# Patient Record
Sex: Female | Born: 2000 | Race: Black or African American | Hispanic: Yes | Marital: Single | Smoking: Never smoker
Health system: Southern US, Community
[De-identification: ages and names within clinical notes are randomized; demographics above are authoritative.]

## PROBLEM LIST (undated history)

## (undated) DIAGNOSIS — A389 Scarlet fever, uncomplicated: Secondary | ICD-10-CM

## (undated) HISTORY — DX: Scarlet fever, uncomplicated: A38.9

---
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
## 2020-02-15 ENCOUNTER — Emergency Department (HOSPITAL_COMMUNITY)
Admission: EM | Admit: 2020-02-15 | Discharge: 2020-02-15 | Disposition: A | Discharge status: Home / Self Care | Payer: BC Managed Care – PPO | Attending: Emergency Medicine | Admitting: Emergency Medicine

## 2020-02-15 ENCOUNTER — Encounter (HOSPITAL_COMMUNITY): Payer: Self-pay | Admitting: Emergency Medicine

## 2020-02-15 ENCOUNTER — Other Ambulatory Visit: Payer: Self-pay

## 2020-02-15 DIAGNOSIS — M79671 Pain in right foot: Secondary | ICD-10-CM | POA: Diagnosis present

## 2020-02-15 DIAGNOSIS — M722 Plantar fascial fibromatosis: Secondary | ICD-10-CM | POA: Diagnosis not present

## 2020-02-15 MED ORDER — NAPROXEN 375 MG PO TABS: 375.0000 mg | ORAL_TABLET | Freq: Two times a day (BID) | ORAL | 0 refills | Status: AC

## 2020-02-15 NOTE — Discharge Instructions (Signed)
You will need to obtain more support such as insert or sneakers with support for your shoes.  I have prescribed anti inflammatories to help with the pain. Follow up with orthopedics as needed. Their phone number is attached to your chart.

## 2020-02-15 NOTE — ED Triage Notes (Signed)
Pt reports bilateral heel pain since the summer.  States pain worse in the mornings when she gets up.  Pain in R foot is radiating to R calf today.

## 2020-02-15 NOTE — ED Provider Notes (Signed)
[facility] EMERGENCY DEPARTMENT Provider Note   CSN: [id number] Arrival date & time: 02/15/20  1519     History Chief Complaint  Patient presents with  . Foot Pain    Beverly Clark is a 20 y.o. female.  20 y.o female with no PMH presents to the ED with a chief complaint of BL foot pain x >6 months. Patient describes pain along the heel with radiation to the back of her foot.Symptoms are exacerbate in the morning when " I take my first step getting out of bed", in addition after work as she stands for several hours. She has not taken any medication for improvement in symptoms. Rest seem so somewhat alleviate this. No fall, no fever, no prior surgeries to her feet. No calf tenderness.   The history is provided by the patient.  Foot Pain       History reviewed. No pertinent past medical history.  There are no problems to display for this patient.   History reviewed. No pertinent surgical history.   OB History   No obstetric history on file.     Family History  Problem Relation Age of Onset  . Diabetes Maternal Grandmother   . Cancer Maternal Grandmother     Social History   Tobacco Use  . Smoking status: Never Smoker  . Smokeless tobacco: Never Used  Substance Use Topics  . Alcohol use: Never  . Drug use: Never    Home Medications Prior to Admission medications   Medication Sig Start Date End Date Taking? Authorizing Provider  naproxen (NAPROSYN) 375 MG tablet Take 1 tablet (375 mg total) by mouth 2 (two) times daily for 7 days. 02/15/20 02/22/20 Yes Tikesha Mort, Leonie Douglas, PA-C    Allergies    Penicillins  Review of Systems   Review of Systems  Constitutional: Negative for fever.  Musculoskeletal: Positive for arthralgias.    Physical Exam Updated Vital Signs BP (!) 155/92 (BP Location: Right Arm)   Pulse 93   Temp 98.6 F (37 C) (Oral)   Resp 16   Ht 5\' 6"  (1.676 m)   Wt 110.2 kg   LMP 01/26/2020   SpO2 99%   BMI 39.22 kg/m    Physical Exam Vitals and nursing note reviewed.  Constitutional:      Appearance: Normal appearance. She is obese.  HENT:     Head: Normocephalic and atraumatic.  Cardiovascular:     Rate and Rhythm: Normal rate.     Pulses:          Dorsalis pedis pulses are 2+ on the right side and 2+ on the left side.       Posterior tibial pulses are 2+ on the right side and 2+ on the left side.  Pulmonary:     Effort: Pulmonary effort is normal.  Abdominal:     General: Abdomen is flat.  Musculoskeletal:     Cervical back: Normal range of motion and neck supple.  Feet:     Right foot:     Skin integrity: Skin integrity normal. No erythema, warmth or callus.     Left foot:     Skin integrity: Skin integrity normal. No erythema, warmth or callus.     Comments: Pulses present, sensation is intact throughout. No pain with flexion or extension. No pain along the medial or lateral malleolus. Pain along the heel region extending into the back of the foot. No calf tenderness bilaterally or pitting edema. Skin:    General:  Skin is warm and dry.  Neurological:     Mental Status: She is alert.     ED Results / Procedures / Treatments   Labs (all labs ordered are listed, but only abnormal results are displayed) Labs Reviewed - No data to display  EKG None  Radiology No results found.  Procedures Procedures   Medications Ordered in ED Medications - No data to display  ED Course  I have reviewed the triage vital signs and the nursing notes.  Pertinent labs & imaging results that were available during my care of the patient were reviewed by me and considered in my medical decision making (see chart for details).    MDM Rules/Calculators/A&P     Patient with no pertinent past medical history presents to the ED with a chief complaint of bilateral foot pain for the past greater than 6 months. She describes the pain as sharp sensation to the heel with radiating to the back of her foot.  No prior injury, no falls.  During evaluation feet are neurovascularly intact, she is ambulatory in the ED with a steady gait. There is pain along the heel with palpation to the back of her ankle. No pain at the lateral or medial malleolus. Suspect likely plantar fasciitis, patient was told previously she had her feet, during evaluation. No arches present to bilateral feet. We discussed supportive shoes, anti-inflammatories to help with pain.  Vitals are within normal limits, no changes in the skin consistent with infection. No calf tenderness, no prior history of DVT. Have a low suspicion for this at this time. Discussed this with patient at length, return precautions discussed.   Portions of this note were generated with Scientist, clinical (histocompatibility and immunogenetics). Dictation errors may occur despite best attempts at proofreading.  Final Clinical Impression(s) / ED Diagnoses Final diagnoses:  Plantar fasciitis of right foot    Rx / DC Orders ED Discharge Orders         Ordered    naproxen (NAPROSYN) 375 MG tablet  2 times daily        02/15/20 2104           Claude Manges, PA-C 02/15/20 2108    Mancel Bale, MD 02/16/20 1606

## 2020-02-15 NOTE — ED Notes (Signed)
PA notified of pt.

## 2021-02-06 ENCOUNTER — Emergency Department (HOSPITAL_COMMUNITY)
Admission: EM | Admit: 2021-02-06 | Discharge: 2021-02-07 | Disposition: A | Discharge status: Home / Self Care | Payer: Medicaid Other | Source: Home / Self Care | Attending: Emergency Medicine | Admitting: Emergency Medicine

## 2021-02-06 ENCOUNTER — Encounter (HOSPITAL_COMMUNITY): Payer: Self-pay

## 2021-02-06 DIAGNOSIS — Z20822 Contact with and (suspected) exposure to covid-19: Secondary | ICD-10-CM | POA: Insufficient documentation

## 2021-02-06 DIAGNOSIS — B349 Viral infection, unspecified: Secondary | ICD-10-CM

## 2021-02-06 LAB — RESP PANEL BY RT-PCR (FLU A&B, COVID) ARPGX2
Influenza A by PCR: NEGATIVE
Influenza B by PCR: NEGATIVE
SARS Coronavirus 2 by RT PCR: NEGATIVE

## 2021-02-06 MED ORDER — ONDANSETRON 4 MG PO TBDP
4.0000 mg | ORAL_TABLET | Freq: Once | ORAL | Status: AC
  Administered 2021-02-07: 4 mg via ORAL

## 2021-02-06 MED ORDER — ACETAMINOPHEN 500 MG PO TABS
1000.0000 mg | ORAL_TABLET | Freq: Four times a day (QID) | ORAL | Status: DC | PRN
  Administered 2021-02-07: 1000 mg via ORAL
  Filled 2021-02-06 (×2): qty 2

## 2021-02-06 NOTE — ED Provider Triage Note (Signed)
Emergency Medicine Provider Triage Evaluation Note  Beverly Clark , a 21 y.o. female  was evaluated in triage.  Pt complains of fever onset today.  She has associated chills, malaise, generalized body aches, nausea.  Has not tried any medications for her symptoms.  Denies vomiting.  Denies sick contacts.  Review of Systems  Positive: As per HPI above. Negative: Vomiting  Physical Exam  BP 130/76 (BP Location: Right Arm)    Pulse (!) 134    Temp (!) 102.3 F (39.1 C) (Oral)    Resp 18    Ht 5\' 6"  (1.676 m)    Wt 110 kg    SpO2 100%    BMI 39.14 kg/m  Gen:   Awake, no distress   Resp:  Normal effort  MSK:   Moves extremities without difficulty   Medical Decision Making  Medically screening exam initiated at 6:34 PM.  Appropriate orders placed.  Beverly Clark was informed that the remainder of the evaluation will be completed by another provider, this initial triage assessment does not replace that evaluation, and the importance of remaining in the ED until their evaluation is complete.    Reka Wist, [place] A, PA-C 02/06/21 1835

## 2021-02-06 NOTE — ED Triage Notes (Signed)
Pt arrives POV for eval of chills/fever, malaise and body aches.Reports she is concerned this may be due to a new tattoo on her R posterior thigh. Tattoo appears to be healing appropriately w/ no obvious s/sx of infection.

## 2021-02-07 ENCOUNTER — Other Ambulatory Visit: Payer: Self-pay

## 2021-02-07 ENCOUNTER — Encounter (HOSPITAL_COMMUNITY): Payer: Self-pay

## 2021-02-07 ENCOUNTER — Emergency Department (HOSPITAL_COMMUNITY): Payer: Medicaid Other

## 2021-02-07 ENCOUNTER — Ambulatory Visit (HOSPITAL_COMMUNITY)
Admission: EM | Admit: 2021-02-07 | Discharge: 2021-02-07 | Disposition: A | Discharge status: Home / Self Care | Payer: Medicaid Other | Attending: Family Medicine | Admitting: Family Medicine

## 2021-02-07 DIAGNOSIS — K529 Noninfective gastroenteritis and colitis, unspecified: Secondary | ICD-10-CM

## 2021-02-07 DIAGNOSIS — R509 Fever, unspecified: Secondary | ICD-10-CM | POA: Diagnosis not present

## 2021-02-07 MED ORDER — CLINDAMYCIN HCL 300 MG PO CAPS: 300.0000 mg | ORAL_CAPSULE | Freq: Three times a day (TID) | ORAL | 0 refills | Status: DC

## 2021-02-07 MED ORDER — ONDANSETRON 4 MG PO TBDP: 4.0000 mg | ORAL_TABLET | Freq: Three times a day (TID) | ORAL | 0 refills | Status: DC | PRN

## 2021-02-07 MED ORDER — ACETAMINOPHEN 325 MG PO TABS: 650.0000 mg | ORAL_TABLET | Freq: Once | ORAL | Status: DC

## 2021-02-07 MED ORDER — ONDANSETRON 4 MG PO TBDP
4.0000 mg | ORAL_TABLET | Freq: Once | ORAL | Status: AC
  Administered 2021-02-07: 4 mg via ORAL
  Filled 2021-02-07: qty 1

## 2021-02-07 NOTE — Discharge Instructions (Addendum)
Use the ondansetron that was prescribed last evening every 8 hours as needed for nausea or vomiting.  Take frequent small amounts of oral fluids.  Take Imodium if your diarrhea is persistent.  If you are not keeping fluids down despite medication and trying to orally hydrate, then return to the emergency room for possible further evaluation with lab work and possible IV hydration   Take clindamycin 300 mg 1 capsule 3 times daily for 5 days

## 2021-02-07 NOTE — ED Provider Notes (Signed)
Patient was medically screened earlier, came in with body aches fevers and chills, respiratory panel was unremarkable  Went to reassess the patient noted that she was febrile and her BP was 96/53 will add on further lab work, patient will need further work-up in the emergency department.       Carroll Sage, PA-C 02/07/21 0459    Dione Booze, MD 02/07/21 [phone]

## 2021-02-07 NOTE — ED Provider Notes (Signed)
[facility] EMERGENCY DEPARTMENT Provider Note   CSN: [id number] Arrival date & time: 02/06/21  1806     History  Chief Complaint  Patient presents with   Fever    Beverly Clark is a 21 y.o. female.  HPI     21 year old female with no significant medical history comes in with chief complaint of fever  Patient indicates that she started having fevers, chills and body aches yesterday.  She recently just had a tattoo on her right thigh, and was concerned that it could be infected.  Patient is sleeping during my assessment.  She indicates that everyone who has seen the tattoo do not think it is infected, and she also now feels like it is not infected.  She does not really think I need to assess the tattoo/skin.  Patient denies any chest pain, shortness of breath, URI-like symptoms, sore throat, cough, UTI-like symptoms, abdominal pain.  She denies any sick contacts.    Home Medications Prior to Admission medications   Not on File      Allergies    Penicillins    Review of Systems   Review of Systems  Physical Exam Updated Vital Signs BP (!) 96/53    Pulse 91    Temp (!) 102.8 F (39.3 C)    Resp 18    Ht 5\' 6"  (1.676 m)    Wt 110 kg    SpO2 98%    BMI 39.14 kg/m  Physical Exam Vitals and nursing note reviewed.  Constitutional:      Appearance: She is well-developed.  HENT:     Head: Atraumatic.  Cardiovascular:     Rate and Rhythm: Normal rate.  Pulmonary:     Effort: Pulmonary effort is normal.  Musculoskeletal:     Cervical back: Normal range of motion and neck supple.  Skin:    General: Skin is warm and dry.  Neurological:     Mental Status: She is alert and oriented to person, place, and time.    ED Results / Procedures / Treatments   Labs (all labs ordered are listed, but only abnormal results are displayed) Labs Reviewed  RESP PANEL BY RT-PCR (FLU A&B, COVID) ARPGX2    EKG None  Radiology No results  found.  Procedures Procedures    Medications Ordered in ED Medications  acetaminophen (TYLENOL) tablet 1,000 mg (1,000 mg Oral Given 02/07/21 0450)  acetaminophen (TYLENOL) tablet 650 mg (has no administration in time range)  ondansetron (ZOFRAN-ODT) disintegrating tablet 4 mg (4 mg Oral Given 02/07/21 0255)  ondansetron (ZOFRAN-ODT) disintegrating tablet 4 mg (4 mg Oral Given 02/07/21 0449)    ED Course/ Medical Decision Making/ A&P                           Medical Decision Making Risk OTC drugs.  21 year old female comes in with chief complaint of fever. It appears that what drove her to come to the ED was concerned for infected soft tissue post tattoo placement on her thigh.  But it seems like her mind is at ease now, and she does not even want me to assess the thigh.  Given that she is noted to be febrile, we did ask comprehensive questions to see if a source of infection can be picked up on history.  Patient has no sore throat, URI-like symptoms, neck pain, headache, chest pain, abdominal pain, UTI-like symptoms.  She denies any nausea or vomiting.  My suspicion for her symptoms being viral in nature is high given the body aches associated with her symptoms.  Discussed this concern with the patient.  Informed her that we could get some basic labs to make sure that rest of her organs are looking okay.  She indicates to me that she is comfortable not getting any further assessment now, and returning to the ER if her symptoms get worse.  Will order Tylenol for her fever. Recommended quarantine until she is better. Informed her that her test is negative for COVID and flu.  Final Clinical Impression(s) / ED Diagnoses Final diagnoses:  Viral illness    Rx / DC Orders ED Discharge Orders     None         Varney Biles, MD 02/07/21 [phone]

## 2021-02-07 NOTE — ED Triage Notes (Signed)
Pt states seen in ED last night for same and home on tylenol. Pt c/o fever, chills, body aches, weakness, and headache since yesterday. C/o diarrhea today. States had imodium and tylenol an hour ago.

## 2021-02-07 NOTE — Discharge Instructions (Addendum)
Likely a viral infection, recommend over-the-counter pain medications like ibuprofen Tylenol for fever and pain control, nasal decongestions like Flonase and Zyrtec, Mucinex for cough.  If not eating recommend supplementing with Gatorade to help with electrolyte supplementation.  Follow-up PCP for further evaluation.  Come back to the emergency department if your symptoms get worse, you develop chest pain, shortness of breath, severe abdominal pain, uncontrolled nausea, vomiting, bloody diarrhea.

## 2021-02-07 NOTE — ED Notes (Signed)
Zofran  order removed by mistake  still active

## 2021-02-07 NOTE — ED Provider Notes (Signed)
[place]    CSN: [id number] Arrival date & time: 02/07/21  1941      History   Chief Complaint Chief Complaint  Patient presents with   Fever    HPI Beverly Clark is a 21 y.o. female.    Fever Here with a history of fever, nausea and vomiting since yesterday in the early afternoon.  She was seen last evening in the [facility], ER for the symptoms.  PCR for flu and COVID were negative.  She was prescribed Zofran; now she has not been using that because she states she is not vomiting.  She does have some nausea when she moves around, but now she is having more diarrhea.  She has had about 4 episodes of diarrhea today no blood.  She has been concerned about her tattoo possibly looking infected her tattoo was placed about 2 days ago.  History reviewed. No pertinent past medical history.  There are no problems to display for this patient.   History reviewed. No pertinent surgical history.  OB History   No obstetric history on file.      Home Medications    Prior to Admission medications   Medication Sig Start Date End Date Taking? Authorizing Provider  clindamycin (CLEOCIN) 300 MG capsule Take 1 capsule (300 mg total) by mouth 3 (three) times daily for 5 days. 02/07/21 02/12/21 Yes Barrett Henle, MD  ondansetron (ZOFRAN-ODT) 4 MG disintegrating tablet Take 1 tablet (4 mg total) by mouth every 8 (eight) hours as needed for nausea or vomiting. 02/07/21  Yes Arlissa Monteverde, Gwenlyn Perking, MD    Family History Family History  Problem Relation Age of Onset   Diabetes Maternal Grandmother    Cancer Maternal Grandmother     Social History Social History   Tobacco Use   Smoking status: Never   Smokeless tobacco: Never  Substance Use Topics   Alcohol use: Never   Drug use: Never     Allergies   Penicillins   Review of Systems Review of Systems  Constitutional:  Positive for fever.    Physical Exam Triage Vital Signs ED Triage Vitals [02/07/21  1953]  Enc Vitals Group     BP 118/64     Pulse Rate (!) 129     Resp 18     Temp (!) 100.5 F (38.1 C)     Temp Source Oral     SpO2 93 %     Weight      Height      Head Circumference      Peak Flow      Pain Score      Pain Loc      Pain Edu?      Excl. in [place]?    No data found.  Updated Vital Signs BP 118/64 (BP Location: Right Arm)    Pulse (!) 129    Temp (!) 100.5 F (38.1 C) (Oral)    Resp 18    LMP 01/18/2021    SpO2 93%   Visual Acuity Right Eye Distance:   Left Eye Distance:   Bilateral Distance:    Right Eye Near:   Left Eye Near:    Bilateral Near:     Physical Exam Vitals reviewed.  Constitutional:      General: She is not in acute distress.    Appearance: She is not toxic-appearing or diaphoretic.     Comments: Lying on the exam table. Answers questions easily, but mom does  answer a lot of my questions instead  HENT:     Nose: Nose normal.     Mouth/Throat:     Mouth: Mucous membranes are moist.  Eyes:     Extraocular Movements: Extraocular movements intact.     Pupils: Pupils are equal, round, and reactive to light.  Cardiovascular:     Rate and Rhythm: Normal rate and regular rhythm.     Heart sounds: No murmur heard. Pulmonary:     Effort: Pulmonary effort is normal.     Breath sounds: Normal breath sounds.  Abdominal:     Palpations: Abdomen is soft.  Skin:    Comments: There is some faint pink nest to the skin and some of her tattoo.  No induration and no ulceration  Neurological:     Mental Status: She is alert and oriented to person, place, and time.  Psychiatric:        Behavior: Behavior normal.     UC Treatments / Results  Labs (all labs ordered are listed, but only abnormal results are displayed) Labs Reviewed - No data to display  EKG   Radiology DG Chest 1 View  Result Date: 02/07/2021 CLINICAL DATA:  21 year old female with fever of unknown origin. EXAM: CHEST  1 VIEW COMPARISON:  None. FINDINGS: PA view at 0520  hours. Normal lung volumes and mediastinal contours. Visualized tracheal air column is within normal limits. Mild hair artifact. Lung markings are within normal limits, both lungs appear clear. No pneumothorax or pleural effusion. Negative visible bowel gas and osseous structures. IMPRESSION: Negative.  No cardiopulmonary abnormality. Electronically Signed   By: Genevie Ann M.D.   On: 02/07/2021 06:24    Procedures Procedures (including critical care time)  Medications Ordered in UC Medications - No data to display  Initial Impression / Assessment and Plan / UC Course  I have reviewed the triage vital signs and the nursing notes.  Pertinent labs & imaging results that were available during my care of the patient were reviewed by me and considered in my medical decision making (see chart for details).     Mom and pt had expected lab work and IVF. Please note, she has not been throwing up today. Discussed using the zofran rx'd last night for the nausea, and to hydrate orally, since she has not been throwing up. Only take imodium if diarrhea is persistent.   Clinda for poss cellulitis. She is allergic to pcn. Final Clinical Impressions(s) / UC Diagnoses   Final diagnoses:  Gastroenteritis  Fever, unspecified fever cause     Discharge Instructions      Use the ondansetron that was prescribed last evening every 8 hours as needed for nausea or vomiting.  Take frequent small amounts of oral fluids.  Take Imodium if your diarrhea is persistent.  If you are not keeping fluids down despite medication and trying to orally hydrate, then return to the emergency room for possible further evaluation with lab work and possible IV hydration   Take clindamycin 300 mg 1 capsule 3 times daily for 5 days     ED Prescriptions     Medication Sig Dispense Auth. Provider   ondansetron (ZOFRAN-ODT) 4 MG disintegrating tablet Take 1 tablet (4 mg total) by mouth every 8 (eight) hours as needed for nausea or  vomiting. 10 tablet Barrett Henle, MD   clindamycin (CLEOCIN) 300 MG capsule Take 1 capsule (300 mg total) by mouth 3 (three) times daily for 5 days. 15 capsule Jeston Junkins,  Gwenlyn Perking, MD      PDMP not reviewed this encounter.   Barrett Henle, MD 02/07/21 2024

## 2021-02-07 NOTE — ED Notes (Signed)
Pt rechecked the pa saw

## 2021-02-08 ENCOUNTER — Emergency Department (HOSPITAL_COMMUNITY): Payer: Medicaid Other

## 2021-02-08 ENCOUNTER — Inpatient Hospital Stay (HOSPITAL_COMMUNITY)
Admission: EM | Admit: 2021-02-08 | Discharge: 2021-02-11 | DRG: 871 | Disposition: A | Discharge status: Home / Self Care | Payer: Medicaid Other | Attending: Family Medicine | Admitting: Family Medicine

## 2021-02-08 ENCOUNTER — Encounter (HOSPITAL_COMMUNITY): Payer: Self-pay

## 2021-02-08 ENCOUNTER — Other Ambulatory Visit: Payer: Self-pay

## 2021-02-08 DIAGNOSIS — E876 Hypokalemia: Secondary | ICD-10-CM | POA: Diagnosis present

## 2021-02-08 DIAGNOSIS — B370 Candidal stomatitis: Secondary | ICD-10-CM | POA: Diagnosis present

## 2021-02-08 DIAGNOSIS — N179 Acute kidney failure, unspecified: Secondary | ICD-10-CM | POA: Diagnosis present

## 2021-02-08 DIAGNOSIS — Z978 Presence of other specified devices: Secondary | ICD-10-CM

## 2021-02-08 DIAGNOSIS — L818 Other specified disorders of pigmentation: Secondary | ICD-10-CM

## 2021-02-08 DIAGNOSIS — R000 Tachycardia, unspecified: Secondary | ICD-10-CM | POA: Diagnosis present

## 2021-02-08 DIAGNOSIS — R3129 Other microscopic hematuria: Secondary | ICD-10-CM | POA: Diagnosis present

## 2021-02-08 DIAGNOSIS — A419 Sepsis, unspecified organism: Principal | ICD-10-CM

## 2021-02-08 DIAGNOSIS — R197 Diarrhea, unspecified: Secondary | ICD-10-CM | POA: Diagnosis present

## 2021-02-08 DIAGNOSIS — R0602 Shortness of breath: Secondary | ICD-10-CM | POA: Diagnosis not present

## 2021-02-08 DIAGNOSIS — J189 Pneumonia, unspecified organism: Secondary | ICD-10-CM | POA: Diagnosis present

## 2021-02-08 DIAGNOSIS — E871 Hypo-osmolality and hyponatremia: Secondary | ICD-10-CM | POA: Diagnosis present

## 2021-02-08 DIAGNOSIS — K130 Diseases of lips: Secondary | ICD-10-CM | POA: Diagnosis present

## 2021-02-08 DIAGNOSIS — R591 Generalized enlarged lymph nodes: Secondary | ICD-10-CM | POA: Diagnosis present

## 2021-02-08 DIAGNOSIS — R6520 Severe sepsis without septic shock: Secondary | ICD-10-CM

## 2021-02-08 DIAGNOSIS — E860 Dehydration: Secondary | ICD-10-CM

## 2021-02-08 DIAGNOSIS — E872 Acidosis, unspecified: Secondary | ICD-10-CM | POA: Diagnosis present

## 2021-02-08 DIAGNOSIS — Z6839 Body mass index (BMI) 39.0-39.9, adult: Secondary | ICD-10-CM

## 2021-02-08 DIAGNOSIS — R748 Abnormal levels of other serum enzymes: Secondary | ICD-10-CM

## 2021-02-08 DIAGNOSIS — Z20822 Contact with and (suspected) exposure to covid-19: Secondary | ICD-10-CM | POA: Diagnosis present

## 2021-02-08 DIAGNOSIS — N139 Obstructive and reflux uropathy, unspecified: Secondary | ICD-10-CM | POA: Diagnosis present

## 2021-02-08 DIAGNOSIS — Z880 Allergy status to penicillin: Secondary | ICD-10-CM | POA: Diagnosis not present

## 2021-02-08 DIAGNOSIS — J811 Chronic pulmonary edema: Secondary | ICD-10-CM | POA: Diagnosis present

## 2021-02-08 DIAGNOSIS — E669 Obesity, unspecified: Secondary | ICD-10-CM | POA: Diagnosis present

## 2021-02-08 LAB — CBC WITH DIFFERENTIAL/PLATELET
Abs Immature Granulocytes: 0 10*3/uL (ref 0.00–0.07)
Basophils Absolute: 0 10*3/uL (ref 0.0–0.1)
Basophils Relative: 0 %
Eosinophils Absolute: 0.4 10*3/uL (ref 0.0–0.5)
Eosinophils Relative: 4 %
HCT: 39.4 % (ref 36.0–46.0)
Hemoglobin: 13.1 g/dL (ref 12.0–15.0)
Lymphocytes Relative: 11 %
Lymphs Abs: 1.1 10*3/uL (ref 0.7–4.0)
MCH: 29.8 pg (ref 26.0–34.0)
MCHC: 33.2 g/dL (ref 30.0–36.0)
MCV: 89.5 fL (ref 80.0–100.0)
Monocytes Absolute: 0.5 10*3/uL (ref 0.1–1.0)
Monocytes Relative: 5 %
Neutro Abs: 7.7 10*3/uL (ref 1.7–7.7)
Neutrophils Relative %: 80 %
Platelets: 184 10*3/uL (ref 150–400)
RBC: 4.4 MIL/uL (ref 3.87–5.11)
RDW: 13.2 % (ref 11.5–15.5)
WBC: 9.6 10*3/uL (ref 4.0–10.5)
nRBC: 0 % (ref 0.0–0.2)

## 2021-02-08 LAB — PHOSPHORUS: Phosphorus: 3.5 mg/dL (ref 2.5–4.6)

## 2021-02-08 LAB — DIFFERENTIAL
Abs Immature Granulocytes: 0.04 10*3/uL (ref 0.00–0.07)
Basophils Absolute: 0 10*3/uL (ref 0.0–0.1)
Basophils Relative: 1 %
Eosinophils Absolute: 0.2 10*3/uL (ref 0.0–0.5)
Eosinophils Relative: 3 %
Immature Granulocytes: 1 %
Lymphocytes Relative: 4 %
Lymphs Abs: 0.3 10*3/uL — ABNORMAL LOW (ref 0.7–4.0)
Monocytes Absolute: 0.1 10*3/uL (ref 0.1–1.0)
Monocytes Relative: 2 %
Neutro Abs: 5.9 10*3/uL (ref 1.7–7.7)
Neutrophils Relative %: 89 %

## 2021-02-08 LAB — BASIC METABOLIC PANEL
Anion gap: 13 (ref 5–15)
BUN: 57 mg/dL — ABNORMAL HIGH (ref 6–20)
CO2: 18 mmol/L — ABNORMAL LOW (ref 22–32)
Calcium: 7.7 mg/dL — ABNORMAL LOW (ref 8.9–10.3)
Chloride: 97 mmol/L — ABNORMAL LOW (ref 98–111)
Creatinine, Ser: 4.97 mg/dL — ABNORMAL HIGH (ref 0.44–1.00)
GFR, Estimated: 12 mL/min — ABNORMAL LOW (ref >60)
Glucose, Bld: 81 mg/dL (ref 70–99)
Potassium: 3.3 mmol/L — ABNORMAL LOW (ref 3.5–5.1)
Sodium: 128 mmol/L — ABNORMAL LOW (ref 135–145)

## 2021-02-08 LAB — CK: Total CK: 190 U/L (ref 38–234)

## 2021-02-08 LAB — RAPID URINE DRUG SCREEN, HOSP PERFORMED
Amphetamines: NOT DETECTED
Barbiturates: NOT DETECTED
Benzodiazepines: NOT DETECTED
Cocaine: NOT DETECTED
Opiates: NOT DETECTED
Tetrahydrocannabinol: NOT DETECTED

## 2021-02-08 LAB — BLOOD GAS, VENOUS
Acid-base deficit: 4.2 mmol/L — ABNORMAL HIGH (ref 0.0–2.0)
Bicarbonate: 20.4 mmol/L (ref 20.0–28.0)
O2 Saturation: 54 %
Patient temperature: 98.6
pCO2, Ven: 37.6 mmHg — ABNORMAL LOW (ref 44.0–60.0)
pH, Ven: 7.353 (ref 7.250–7.430)
pO2, Ven: 33.9 mmHg (ref 32.0–45.0)

## 2021-02-08 LAB — CBC
HCT: 37.6 % (ref 36.0–46.0)
Hemoglobin: 12.9 g/dL (ref 12.0–15.0)
MCH: 30 pg (ref 26.0–34.0)
MCHC: 34.3 g/dL (ref 30.0–36.0)
MCV: 87.4 fL (ref 80.0–100.0)
Platelets: 174 10*3/uL (ref 150–400)
RBC: 4.3 MIL/uL (ref 3.87–5.11)
RDW: 13.2 % (ref 11.5–15.5)
WBC: 6.6 10*3/uL (ref 4.0–10.5)
nRBC: 0 % (ref 0.0–0.2)

## 2021-02-08 LAB — ETHANOL: Alcohol, Ethyl (B): <10 mg/dL (ref <10)

## 2021-02-08 LAB — COMPREHENSIVE METABOLIC PANEL
ALT: 39 U/L (ref 0–44)
AST: 24 U/L (ref 15–41)
Albumin: 2.9 g/dL — ABNORMAL LOW (ref 3.5–5.0)
Alkaline Phosphatase: 48 U/L (ref 38–126)
Anion gap: 13 (ref 5–15)
BUN: 54 mg/dL — ABNORMAL HIGH (ref 6–20)
CO2: 19 mmol/L — ABNORMAL LOW (ref 22–32)
Calcium: 7.6 mg/dL — ABNORMAL LOW (ref 8.9–10.3)
Chloride: 96 mmol/L — ABNORMAL LOW (ref 98–111)
Creatinine, Ser: 5.04 mg/dL — ABNORMAL HIGH (ref 0.44–1.00)
GFR, Estimated: 12 mL/min — ABNORMAL LOW (ref >60)
Glucose, Bld: 90 mg/dL (ref 70–99)
Potassium: 3.2 mmol/L — ABNORMAL LOW (ref 3.5–5.1)
Sodium: 128 mmol/L — ABNORMAL LOW (ref 135–145)
Total Bilirubin: 2.6 mg/dL — ABNORMAL HIGH (ref 0.3–1.2)
Total Protein: 6.3 g/dL — ABNORMAL LOW (ref 6.5–8.1)

## 2021-02-08 LAB — URINALYSIS, ROUTINE W REFLEX MICROSCOPIC
Bilirubin Urine: NEGATIVE
Glucose, UA: NEGATIVE mg/dL
Hgb urine dipstick: NEGATIVE
Ketones, ur: NEGATIVE mg/dL
Nitrite: NEGATIVE
Protein, ur: 30 mg/dL — AB
Specific Gravity, Urine: 1.021 (ref 1.005–1.030)
pH: 5 (ref 5.0–8.0)

## 2021-02-08 LAB — TSH: TSH: 0.775 u[IU]/mL (ref 0.350–4.500)

## 2021-02-08 LAB — RAPID HIV SCREEN (HIV 1/2 AB+AG)
HIV 1/2 Antibodies: NONREACTIVE
HIV-1 P24 Antigen - HIV24: NONREACTIVE

## 2021-02-08 LAB — HEPATIC FUNCTION PANEL
ALT: 38 U/L (ref 0–44)
AST: 25 U/L (ref 15–41)
Albumin: 2.8 g/dL — ABNORMAL LOW (ref 3.5–5.0)
Alkaline Phosphatase: 48 U/L (ref 38–126)
Bilirubin, Direct: 1.3 mg/dL — ABNORMAL HIGH (ref 0.0–0.2)
Indirect Bilirubin: 1.3 mg/dL — ABNORMAL HIGH (ref 0.3–0.9)
Total Bilirubin: 2.6 mg/dL — ABNORMAL HIGH (ref 0.3–1.2)
Total Protein: 6.2 g/dL — ABNORMAL LOW (ref 6.5–8.1)

## 2021-02-08 LAB — PROTIME-INR
INR: 1.2 (ref 0.8–1.2)
Prothrombin Time: 15.1 seconds (ref 11.4–15.2)

## 2021-02-08 LAB — MAGNESIUM: Magnesium: 1.3 mg/dL — ABNORMAL LOW (ref 1.7–2.4)

## 2021-02-08 LAB — RESP PANEL BY RT-PCR (FLU A&B, COVID) ARPGX2
Influenza A by PCR: NEGATIVE
Influenza B by PCR: NEGATIVE
SARS Coronavirus 2 by RT PCR: NEGATIVE

## 2021-02-08 LAB — APTT: aPTT: 32 seconds (ref 24–36)

## 2021-02-08 LAB — LACTIC ACID, PLASMA
Lactic Acid, Venous: 2.5 mmol/L (ref 0.5–1.9)
Lactic Acid, Venous: 2.6 mmol/L (ref 0.5–1.9)

## 2021-02-08 LAB — PROCALCITONIN: Procalcitonin: 19.2 ng/mL

## 2021-02-08 LAB — HCG, QUANTITATIVE, PREGNANCY: hCG, Beta Chain, Quant, S: 1 m[IU]/mL (ref <5)

## 2021-02-08 LAB — SEDIMENTATION RATE: Sed Rate: 40 mm/hr — ABNORMAL HIGH (ref 0–22)

## 2021-02-08 LAB — SODIUM, URINE, RANDOM: Sodium, Ur: 11 mmol/L

## 2021-02-08 LAB — CREATININE, URINE, RANDOM: Creatinine, Urine: 355.72 mg/dL

## 2021-02-08 MED ORDER — MAGNESIUM SULFATE 2 GM/50ML IV SOLN
2.0000 g | Freq: Once | INTRAVENOUS | Status: AC
  Administered 2021-02-08: 23:00:00 2 g via INTRAVENOUS
  Filled 2021-02-08: qty 50

## 2021-02-08 MED ORDER — METRONIDAZOLE 500 MG/100ML IV SOLN
500.0000 mg | Freq: Once | INTRAVENOUS | Status: AC
  Administered 2021-02-08: 20:00:00 500 mg via INTRAVENOUS
  Filled 2021-02-08: qty 100

## 2021-02-08 MED ORDER — SODIUM CHLORIDE 0.9 % IV BOLUS
1000.0000 mL | Freq: Once | INTRAVENOUS | Status: AC
Start: 2021-02-09 — End: 2021-02-09
  Administered 2021-02-09: 1000 mL via INTRAVENOUS

## 2021-02-08 MED ORDER — ACETAMINOPHEN 325 MG PO TABS
650.0000 mg | ORAL_TABLET | Freq: Once | ORAL | Status: AC | PRN
  Administered 2021-02-08: 15:00:00 650 mg via ORAL
  Filled 2021-02-08: qty 2

## 2021-02-08 MED ORDER — LACTATED RINGERS IV SOLN: INTRAVENOUS | Status: DC

## 2021-02-08 MED ORDER — ACETAMINOPHEN 650 MG RE SUPP: 650.0000 mg | Freq: Four times a day (QID) | RECTAL | Status: DC | PRN

## 2021-02-08 MED ORDER — METRONIDAZOLE 500 MG/100ML IV SOLN
500.0000 mg | Freq: Two times a day (BID) | INTRAVENOUS | Status: DC
  Administered 2021-02-09 – 2021-02-11 (×5): 500 mg via INTRAVENOUS
  Filled 2021-02-08 (×5): qty 100

## 2021-02-08 MED ORDER — ACETAMINOPHEN 325 MG PO TABS
650.0000 mg | ORAL_TABLET | Freq: Four times a day (QID) | ORAL | Status: DC | PRN
  Administered 2021-02-11: 650 mg via ORAL
  Filled 2021-02-08 (×2): qty 2

## 2021-02-08 MED ORDER — HYDROCODONE-ACETAMINOPHEN 5-325 MG PO TABS
1.0000 | ORAL_TABLET | ORAL | Status: DC | PRN
  Administered 2021-02-09: 2 via ORAL
  Filled 2021-02-08: qty 2

## 2021-02-08 MED ORDER — SODIUM CHLORIDE 0.9 % IV SOLN: 2.0000 g | INTRAVENOUS | Status: DC

## 2021-02-08 MED ORDER — CLOTRIMAZOLE 10 MG MT TROC
10.0000 mg | Freq: Every day | OROMUCOSAL | Status: DC
  Administered 2021-02-08 – 2021-02-11 (×14): 10 mg via ORAL
  Filled 2021-02-08 (×20): qty 1

## 2021-02-08 MED ORDER — SODIUM CHLORIDE 0.9 % IV SOLN: INTRAVENOUS | Status: DC | PRN

## 2021-02-08 MED ORDER — POTASSIUM CHLORIDE CRYS ER 20 MEQ PO TBCR
40.0000 meq | EXTENDED_RELEASE_TABLET | Freq: Once | ORAL | Status: AC
  Administered 2021-02-08: 21:00:00 40 meq via ORAL
  Filled 2021-02-08: qty 2

## 2021-02-08 MED ORDER — ACETAMINOPHEN 500 MG PO TABS
1000.0000 mg | ORAL_TABLET | Freq: Once | ORAL | Status: AC
  Administered 2021-02-09: 11:00:00 1000 mg via ORAL
  Filled 2021-02-08: qty 2

## 2021-02-08 MED ORDER — SODIUM CHLORIDE 0.9 % IV SOLN
2.0000 g | Freq: Once | INTRAVENOUS | Status: AC
  Administered 2021-02-08: 20:00:00 2 g via INTRAVENOUS
  Filled 2021-02-08: qty 2

## 2021-02-08 NOTE — H&P (Addendum)
Beverly Clark RZN:[id number] DOB: [birth year] DOA: 02/08/2021   PCP: Merryl Hacker, No   Outpatient Specialists:  NONE    Patient arrived to ER on 02/08/21 at 1449 Referred by Attending Kommor, Debe Coder, MD   Patient coming from: home Lives  With family    Chief Complaint:   Chief Complaint  Patient presents with   Dizziness   Fever    HPI: Beverly Clark is a 21 y.o. female with no  significant medical history     Presented with   fever chills decreased PO intake diarrhea started 3 days ago Fever, chills, body aches, have been seen in ER fo r the 3rd time Yesterday was given a dose of clindamycin for presumed infected tattoo Diarrhea started 4 days ago liquids stool took imodium with improvement No blood in stool  A total of 4-5 BM No vomiting She has been drinking water a lot in general but not since been sick No energy to sit up So for past few days only had a few sips of water and Gatorade She was so weak she has not been urinating much No sick contacts  She has been taking tylenol every 6 h as needed She took something for nausea She and her friend had some  noodles few day before this started but her friend  had no problems  No urinaion since this weekend  She work at [employer]  Has  been vaccinated against [place] and boosted had   flu shot   Initial COVID TEST  NEGATIVE   Lab Results  Component Value Date   [place] 02/08/2021   [place] NEGATIVE 02/06/2021      While in ER: Clinical Course as of 02/08/21 2202  Tue Feb 08, 2021  1600 DG Chest [place] 1 View Chest x-ray normal [EC]  1725 CBC WITH DIFFERENTIAL CBC normal without evidence of systemic infection or anemia [EC]  1738 Resp Panel by RT-PCR (Flu A&B, Covid) Nasopharyngeal Swab Respiratory panel negative [EC]  1739 Lactic acid, plasma(!!) Lactic acid elevated at 2.5 [EC]  3013 Basic metabolic panel(!) BMP very concerning for creatinine of 5.04, BUN of 54.  Potassium low, GFR of  12.  Anion gap is normal. [EC]  2000 Patient unable to provide a urine sample despite adequate amounts of urine seen on bladder scan.  RN to place Foley catheter to obtain urine sample. [EC]  2035 BP(!): 101/34 [EC]  2151 CT ABDOMEN PELVIS WO CONTRAST CT abdomen without evidence of hydronephrosis, obstructive renal pathology.  There is some renal fat stranding, possibly consistent with UTI. [EC]    Clinical Course User Index [EC] Tonye Pearson, PA-C   Hypotensive 90/60 gotten 2 L    Started cefepime /flagyl empirically    CXR -  NON acute  CTabd/pelvis -  non  acute, no hydro Hepatic steatosis  US renal - echogenic changes   Following Medications were ordered in ER: Medications  lactated ringers infusion (0 mLs Intravenous Stopped 02/08/21 1902)  clotrimazole (MYCELEX) troche 10 mg (10 mg Oral Given 02/08/21 1751)  acetaminophen (TYLENOL) tablet 1,000 mg (1,000 mg Oral Not Given 02/08/21 1749)  metroNIDAZOLE (FLAGYL) IVPB 500 mg (500 mg Intravenous New Bag/Given 02/08/21 2006)  acetaminophen (TYLENOL) tablet 650 mg (650 mg Oral Given 02/08/21 1509)  ceFEPIme (MAXIPIME) 2 g in sodium chloride 0.9 % 100 mL IVPB (0 g Intravenous Stopped 02/08/21 2006)       ED Triage Vitals  Enc Vitals Group  BP 02/08/21 1501 (!) 106/57     Pulse Rate 02/08/21 1501 (!) 130     Resp 02/08/21 1501 18     Temp 02/08/21 1459 (!) 102.1 F (38.9 C)     Temp Source 02/08/21 1902 Oral     SpO2 02/08/21 1501 98 %     Weight 02/08/21 1502 242 lb 8.1 oz (110 kg)     Height 02/08/21 1502 _0  (1.676 m)     Head Circumference --      Peak Flow --      Pain Score 02/08/21 1502 4     Pain Loc --      Pain Edu? --      Excl. in [place]? --   TMAX(24)@     _________________________________________ Significant initial  Findings: Abnormal Labs Reviewed  LACTIC ACID, PLASMA - Abnormal; Notable for the following components:      Result Value   Lactic Acid, Venous 2.5 (*)    All other components within  normal limits  COMPREHENSIVE METABOLIC PANEL - Abnormal; Notable for the following components:   Sodium 128 (*)    Potassium 3.2 (*)    Chloride 96 (*)    CO2 19 (*)    BUN 54 (*)    Creatinine, Ser 5.04 (*)    Calcium 7.6 (*)    Total Protein 6.3 (*)    Albumin 2.9 (*)    Total Bilirubin 2.6 (*)    GFR, Estimated 12 (*)    All other components within normal limits      ECG: Ordered Personally reviewed by me showing: HR : 119 Rhythm:   Sinus tachycardia   nonspecific changes,   QTC 428   ____________________ This patient meets SIRS Criteria and may be septic.    The recent clinical data is shown below. Vitals:   02/08/21 1830 02/08/21 1902 02/08/21 1915 02/08/21 2015  BP: (!) 102/50  (!) 101/34   Pulse: (!) 117  (!) 120 (!) 125  Resp: (!) 26  (!) 25 (!) 27  Temp:  98.5 F (36.9 C)    TempSrc:  Oral    SpO2: 98%  96% 97%  Weight:      Height:        WBC     Component Value Date/Time   WBC 9.6 02/08/2021 1723   LYMPHSABS 1.1 02/08/2021 1723   MONOABS 0.5 02/08/2021 1723   EOSABS 0.4 02/08/2021 1723   BASOSABS 0.0 02/08/2021 1723    Lactic Acid, Venous    Component Value Date/Time   LATICACIDVEN 2.5 (HH) 02/08/2021 1723     Procalcitonin  19 Lactic Acid, Venous    Component Value Date/Time   LATICACIDVEN 2.5 (HH) 02/08/2021 1723      UA no evidence of UTI      Urine analysis:    Component Value Date/Time   COLORURINE AMBER (A) 02/08/2021 2040   APPEARANCEUR CLOUDY (A) 02/08/2021 2040   LABSPEC 1.021 02/08/2021 2040   PHURINE 5.0 02/08/2021 2040   GLUCOSEU NEGATIVE 02/08/2021 2040   HGBUR NEGATIVE 02/08/2021 2040   BILIRUBINUR NEGATIVE 02/08/2021 2040   BILIRUBINUR negative 07/05/2017 1616   KETONESUR NEGATIVE 02/08/2021 2040   PROTEINUR 30 (A) 02/08/2021 2040   UROBILINOGEN 0.2 07/05/2017 1616   NITRITE NEGATIVE 02/08/2021 2040   LEUKOCYTESUR TRACE (A) 02/08/2021 2040    Results for orders placed or performed during the hospital encounter  of 02/08/21  Resp Panel by RT-PCR (Flu A&B, Covid) Nasopharyngeal Swab  Status: None   Collection Time: 02/08/21  5:23 PM   Specimen: Nasopharyngeal Swab; Nasopharyngeal(NP) swabs in vial transport medium  Result Value Ref Range Status   SARS Coronavirus 2 by RT PCR NEGATIVE NEGATIVE Final         Influenza A by PCR NEGATIVE NEGATIVE Final   Influenza B by PCR NEGATIVE NEGATIVE Final          _______________________________________________ Hospitalist was called for admission for Sepsis and AKI  The following Work up has been ordered so far:  Orders Placed This Encounter  Procedures   Resp Panel by RT-PCR (Flu A&B, Covid) Nasopharyngeal Swab   Blood Culture (routine x 2)   Urine Culture   DG Chest Port 1 View   US RENAL   CT ABDOMEN PELVIS WO CONTRAST   Lactic acid, plasma   Comprehensive metabolic panel   CBC WITH DIFFERENTIAL   Protime-INR   APTT   hCG, quantitative, pregnancy   Rapid HIV screen (HIV 1/2 Ab+Ag)   Urinalysis, Routine w reflex microscopic Urine, In & Out Cath   Diet NPO time specified   Cardiac monitoring   Document height and weight   Assess and Document Glasgow Coma Scale   Document vital signs within 1-hour of fluid bolus completion. Notify provider of abnormal vital signs despite fluid resuscitation.   DO NOT delay antibiotics if unable to obtain blood culture.   Refer to Sidebar Report: Sepsis Sidebar ED/IP   Notify provider for difficulties obtaining IV access.   Insert peripheral IV x 2   Initiate Carrier Fluid Protocol   Insert foley catheter   In and Out Cath   Consult to hospitalist   Pulse oximetry, continuous   ED EKG 12-Lead     OTHER Significant initial  Findings:  labs showing:    Recent Labs  Lab 02/08/21 1723  NA 128*  K 3.2*  CO2 19*  GLUCOSE 90  BUN 54*  CREATININE 5.04*  CALCIUM 7.6*    Cr  Up from baseline see below Lab Results  Component Value Date   CREATININE 5.04 (H) 02/08/2021    Recent Labs  Lab  02/08/21 1723  AST 24  ALT 39  ALKPHOS 48  BILITOT 2.6*  PROT 6.3*  ALBUMIN 2.9*   Lab Results  Component Value Date   CALCIUM 7.6 (L) 02/08/2021    Plt: Lab Results  Component Value Date   PLT 184 02/08/2021      COVID-19 Labs  No results for input(s): DDIMER, FERRITIN, LDH, CRP in the last 72 hours.  Lab Results  Component Value Date   SARSCOV2NAA NEGATIVE 02/08/2021   [place] NEGATIVE 02/06/2021    Venous  Blood Gas result:  pH 7.353 pCO2  37.6  ABG    Component Value Date/Time   HCO3 20.4 02/08/2021 2137   ACIDBASEDEF 4.2 (H) 02/08/2021 2137   O2SAT 54.0 02/08/2021 2137       Recent Labs  Lab 02/08/21 1723  WBC 9.6  NEUTROABS 7.7  HGB 13.1  HCT 39.4  MCV 89.5  PLT 184    HG/HCT   stable,     Component Value Date/Time   HGB 13.1 02/08/2021 1723   HCT 39.4 02/08/2021 1723   MCV 89.5 02/08/2021 1723   MCV 85.9 07/09/2017 1448     Recent Labs    02/08/21 2040  CKTOTAL 190        Cultures: No results found for: SDES, [place], [place], REPTSTATUS   Radiological Exams on  Admission: CT ABDOMEN PELVIS WO CONTRAST  Result Date: 02/08/2021 CLINICAL DATA:  Bladder neck obstruction, rule out obstructing stone. EXAM: CT ABDOMEN AND PELVIS WITHOUT CONTRAST TECHNIQUE: Multidetector CT imaging of the abdomen and pelvis was performed following the standard protocol without IV contrast. RADIATION DOSE REDUCTION: This exam was performed according to the departmental dose-optimization program which includes automated exposure control, adjustment of the mA and/or kV according to patient size and/or use of iterative reconstruction technique. COMPARISON:  None. FINDINGS: Lower chest: Streaky and linear ground-glass opacities in the lung bases, typical of scarring and hypoventilatory change. Trace bilateral pleural thickening without significant effusion. Hepatobiliary: Decreased hepatic density typical of steatosis no focal hepatic lesion on this unenhanced  exam. Gallbladder is poorly defined, tentatively visualized. No calcified gallstone. No biliary dilatation. Pancreas: No ductal dilatation or inflammation. Spleen: Normal in size without focal abnormality. Splenule anteriorly. Adrenals/Urinary Tract: No adrenal nodule. No hydronephrosis or renal calculi. No significant perinephric edema. There is indistinctness of the right renal sinus fat. Both ureters are decompressed without stones along the course. Urinary bladder is physiologically distended. There is no bladder stone. No bladder wall thickening or evidence of focal mass on this unenhanced exam. No urethral stone is visualized. Stomach/Bowel: Stomach is within normal limits. Appendix appears normal. No evidence of bowel wall thickening, distention, or inflammatory changes. Vascular/Lymphatic: Normal caliber abdominal aorta. No portal venous or mesenteric gas. Scattered small retroperitoneal and central mesenteric nodes, not enlarged by size criteria, typically reactive. Reproductive: Anteverted uterus. 2.7 cm follicular cyst in the left ovary is physiologic. No dedicated further evaluation is needed. Normal CT appearance of the right ovary. No adnexal mass. Other: No free air, free fluid, or intra-abdominal fluid collection. Small fat containing umbilical hernia. Musculoskeletal: There are no acute or suspicious osseous abnormalities. IMPRESSION: 1. No urolithiasis or obstructive uropathy. 2. Indistinct right renal sinus fat, can be seen with urinary tract infection. Recommend correlation with urinalysis. 3. Hepatic steatosis. 4. Small fat containing umbilical hernia. Electronically Signed   By: Keith Rake M.D.   On: 02/08/2021 19:35   DG Chest 1 View  Result Date: 02/07/2021 CLINICAL DATA:  21 year old female with fever of unknown origin. EXAM: CHEST  1 VIEW COMPARISON:  None. FINDINGS: PA view at 0520 hours. Normal lung volumes and mediastinal contours. Visualized tracheal air column is within  normal limits. Mild hair artifact. Lung markings are within normal limits, both lungs appear clear. No pneumothorax or pleural effusion. Negative visible bowel gas and osseous structures. IMPRESSION: Negative.  No cardiopulmonary abnormality. Electronically Signed   By: Genevie Ann M.D.   On: 02/07/2021 06:24   US RENAL  Result Date: 02/08/2021 CLINICAL DATA:  Urinary tract obstruction. EXAM: RENAL / URINARY TRACT ULTRASOUND COMPLETE COMPARISON:  CT abdomen pelvis dated 02/08/2021. FINDINGS: Right Kidney: Renal measurements: 13.3 x 5.7 x 7.2 cm = volume: 284 mL. Increased echogenicity. No hydronephrosis or shadowing stone. Left Kidney: Renal measurements: 14.0 x 7.3 x 7.4 cm = volume: 392 mL. Increased echogenicity. No hydronephrosis or shadowing stone. Bladder: Appears normal for degree of bladder distention. Other: None. IMPRESSION: Mildly echogenic kidneys may represent medical renal disease. No hydronephrosis or shadowing stone. Electronically Signed   By: Anner Crete M.D.   On: 02/08/2021 19:58   DG Chest Port 1 View  Result Date: 02/08/2021 CLINICAL DATA:  Questionable sepsis EXAM: PORTABLE CHEST 1 VIEW COMPARISON:  February 07, 2021 FINDINGS: The heart size and mediastinal contours are within normal limits. Both lungs are clear. The visualized  skeletal structures are unremarkable. IMPRESSION: No active disease. Electronically Signed   By: Dahlia Bailiff M.D.   On: 02/08/2021 15:31   _______________________________________________________________________________________________________ Latest  Blood pressure (!) 101/34, pulse (!) 125, temperature 98.5 F (36.9 C), temperature source Oral, resp. rate (!) 27, height _0  (1.676 m), weight 110 kg, last menstrual period 01/18/2021, SpO2 97 %.   Vitals  labs and radiology finding personally reviewed  Review of Systems:    Pertinent positives include:  Fevers, chills, fatigue,  nausea,   diarrhea, Constitutional:  No weight loss, night sweats,   weight loss  HEENT:  No headaches, Difficulty swallowing,Tooth/dental problems,Sore throat,  No sneezing, itching, ear ache, nasal congestion, post nasal drip,  Cardio-vascular:  No chest pain, Orthopnea, PND, anasarca, dizziness, palpitations.no Bilateral lower extremity swelling  GI:  No heartburn, indigestion, a change in bowel habits, loss of appetite, melena, blood in stool, hematemesis Resp:  no shortness of breath at rest. No dyspnea on exertion, No excess mucus, no productive cough, No non-productive cough, No coughing up of blood.No change in color of mucus.No wheezing. Skin:  no rash or lesions. No jaundice GU:  no dysuria, change in color of urine, no urgency or frequency. No straining to urinate.  No flank pain.  Musculoskeletal:  No joint pain or no joint swelling. No decreased range of motion. No back pain.  Psych:  No change in mood or affect. No depression or anxiety. No memory loss.  Neuro: no localizing neurological complaints, no tingling, no weakness, no double vision, no gait abnormality, no slurred speech, no confusion  All systems reviewed and apart from [place] all are negative _______________________________________________________________________________________________ Past Medical History:  History reviewed. No pertinent past medical history.    History reviewed. No pertinent surgical history.  Social History:  Ambulatory   independently       reports that she has never smoked. She has never used smokeless tobacco. She reports that she does not drink alcohol and does not use drugs.   Family History:   Family History  Problem Relation Age of Onset   Diabetes Maternal Grandmother    Cancer Maternal Grandmother    ______________________________________________________________________________________________ Allergies: Allergies  Allergen Reactions   Penicillins Hives     Prior to Admission medications   Medication Sig Start Date End Date Taking?  Authorizing Provider  clindamycin (CLEOCIN) 300 MG capsule Take 1 capsule (300 mg total) by mouth 3 (three) times daily for 5 days. 02/07/21 02/12/21  Barrett Henle, MD  ondansetron (ZOFRAN-ODT) 4 MG disintegrating tablet Take 1 tablet (4 mg total) by mouth every 8 (eight) hours as needed for nausea or vomiting. 02/07/21   Barrett Henle, MD    ___________________________________________________________________________________________________ Physical Exam: Vitals with BMI 02/08/2021 02/08/2021 02/08/2021  Height - - -  Weight - - -  BMI - - -  Systolic - 448 185  Diastolic - 34 50  Pulse 631 120 117     1. General:  in No  Acute distress   acutely ill -appearing 2. Psychological: Alert and   Oriented 3. Head/ENT:   Dry Mucous Membranes                          Head Non traumatic, neck supple                        Poor Dentition 4. SKIN:  decreased Skin turgor,  Skin clean Dry and intact  no rash 5. Heart: Regular rate and rhythm no  Murmur, no Rub or gallop 6. Lungs: Clear to auscultation bilaterally, no wheezes or crackles   7. Abdomen: Soft,  non-tender, Non distended   obese  bowel sounds present 8. Lower extremities: no clubbing, cyanosis, no  edema 9. Neurologically Grossly intact, moving all 4 extremities equally   10. MSK: Normal range of motion    Chart has been reviewed  ______________________________________________________________________________________________  Assessment/Plan  21 y.o. female with no  significant medical history     Admitted for  Sepsis  AKI  Present on Admission:  Sepsis ([place])  AKI (acute kidney injury) ([place])  Hyponatremia  Hypokalemia  Dehydration  Hypomagnesemia  Diarrhea     Sepsis ([place])  -SIRS criteria met with   tachycardia   fever  RR >20 Today's Vitals   02/08/21 1902 02/08/21 1902 02/08/21 1915 02/08/21 2015  BP:   (!) 101/34   Pulse:   (!) 120 (!) 125  Resp:   (!) 25 (!) 27  Temp: 98.5 F (36.9 C)     TempSrc:  Oral     SpO2:   96% 97%  Weight:      Height:      PainSc:  4      Body mass index is 39.14 kg/m.   The recent clinical data is shown below. Vitals:   02/08/21 1830 02/08/21 1902 02/08/21 1915 02/08/21 2015  BP: (!) 102/50  (!) 101/34   Pulse: (!) 117  (!) 120 (!) 125  Resp: (!) 26  (!) 25 (!) 27  Temp:  98.5 F (36.9 C)    TempSrc:  Oral    SpO2: 98%  96% 97%  Weight:      Height:        Source of sepsis is unknown but given clinical picture will continue to treat   Patient meeting criteria for Severe sepsis with    evidence of end organ damage/organ dysfunction such as   Acute Kidney Injury with Cr > 2,  Lab Results  Component Value Date   CREATININE 4.97 (H) 02/08/2021   CREATININE 5.04 (H) 02/08/2021    elevated lactic acid >2     Component Value Date/Time   LATICACIDVEN 2.6 (HH) 02/08/2021 2051      SBP<90 mmhg or MAP < 65 mmhg,      - Obtain serial lactic acid and procalcitonin level.  - Initiated IV antibiotics in ER: Antibiotics Given (last 72 hours)     Date/Time Action Medication Dose Rate   02/08/21 1941 New Bag/Given   ceFEPIme (MAXIPIME) 2 g in sodium chloride 0.9 % 100 mL IVPB 2 g 200 mL/hr   02/08/21 2006 New Bag/Given   metroNIDAZOLE (FLAGYL) IVPB 500 mg 500 mg 100 mL/hr      Will continue     - await results of blood and urine culture  - Rehydrate aggressively  Intravenous fluids were administered   30cc/kg fluid   10:14 PM   AKI (acute kidney injury) ([place]) -  evidence of acute renal failure due to presence of following: Cr increased >0.3 from baseline likely secondary to dehydration,        check FeNA       Rehydrate with IV fluids  History does not suggest urinary retention or obstruction       Given severity  obtained renal US no hydro Slightly echogenic kidneys For completion ordered inflammatory markers Discussed with Dr. Posey Pronto with nephrology He recommends: Hydrate her  well overnight and see what labs do in the morning to  decide if she needs a formal consult inpatient or can be seen electively as an outpatient. Kidney size is large with normal hgb and phos/Ca- so I doubt this is truly chronic    Hyponatremia In the setting of dehydration Will rehydrate obtain urine electrolytes monitor check TSH  Hypokalemia Replace and follow  Dehydration Rehydrate aggressively and follow fluid status  Hypomagnesemia We will replace and follow  Diarrhea Only had about 4 bowel movements currently improved with Imodium.  If repeat obtain gastric panel checked today for completion. CT scan unremarkable no evidence of colitis  Lactic acidosis Persistent despite IV fluid rehydration, abd non tender, CTA abd neg Pt clinically improving renal function improving Will continue to rehydrate and recheck    Other plan as per orders.  DVT prophylaxis:  SCD         Code Status:    Code Status: Not on file FULL CODE  as per patient   I had personally discussed CODE STATUS with patient     Family Communication:   Family  at  Bedside  plan of care was discussed  with mother  Disposition Plan:    To home once workup is complete and patient is stable   Following barriers for discharge:                            Electrolytes corrected                                                       Pain controlled with PO medications                               Afebrile, white count improving able to transition to PO antibiotics                             Will need to be able to tolerate PO                                                       Will need consultants to evaluate patient prior to discharge                       Consults called:    sent msg to Nephrology if no improvement in renal function would obtain consult in a.m. Discussed with PCCM re consult if no improvement  Admission status:  ED Disposition     ED Disposition  Admit   Condition  --   [place]: [place]  [[id number]]  Level of Care: Progressive [102]  Admit to Progressive based on following criteria: MULTISYSTEM THREATS such as stable sepsis, metabolic/electrolyte imbalance with or without encephalopathy that is responding to early treatment.  May admit patient to [facility] or [facility] if equivalent level of care is available:: No  Covid Evaluation: Confirmed COVID Negative  Diagnosis: Sepsis [facility]) [[id number]]  Admitting Physician: Roel Cluck  Darrell Leonhardt [[id number]]  Attending Physician: Toy Baker [[id number]]  Estimated length of stay: past midnight tomorrow  Certification:: I certify this patient will need inpatient services for at least 2 midnights            inpatient     I Expect 2 midnight stay secondary to severity of patient's current illness need for inpatient interventions justified by the following:  hemodynamic instability despite optimal treatment (tachycardia  )   Severe lab/radiological/exam abnormalities including:    AKI     That are currently affecting medical management.   I expect  patient to be hospitalized for 2 midnights requiring inpatient medical care.  Patient is at high risk for adverse outcome (such as loss of life or disability) if not treated.  Indication for inpatient stay as follows:    Hemodynamic instability despite maximal medical therapy,    inability to maintain oral hydration    Need for operative/procedural  intervention     Need for IV antibiotics, IV fluids,      Level of care        progressive tele indefinitely please discontinue once patient no longer qualifies COVID-19 Labs    Lab Results  Component Value Date   [place] 02/08/2021     Precautions: admitted as   Covid Negative       Shaylea Ucci 02/08/2021, 10:24 PM    [facility]     after 2 AM please page floor coverage PA If 7AM-7PM, please contact the day team taking care of the patient using [email or web address]   Patient was evaluated in the  context of the global COVID-19 pandemic, which necessitated consideration that the patient might be at risk for infection with the SARS-CoV-2 virus that causes COVID-19. Institutional protocols and algorithms that pertain to the evaluation of patients at risk for COVID-19 are in a state of rapid change based on information released by regulatory bodies including the CDC and federal and state organizations. These policies and algorithms were followed during the patient's care.

## 2021-02-08 NOTE — ED Provider Notes (Signed)
[facility] DEPT Provider Note   CSN: [id number] Arrival date & time: 02/08/21  1449     History  Chief Complaint  Patient presents with   Dizziness   Fever    Beverly Clark is a 21 y.o. female who presents to the ED for evaluation of fevers, nausea, chills and body aches that has been ongoing since 02/06/2021.  Patient first went to the ED that day where it was suspected that she had a viral URI.  She was concerned at that time for possible infection of a recently placed tattoo on 02/05/2021, however the tattoo did not seem infected per previous exams.  She then went to urgent care yesterday as her symptoms progressed into nausea and diarrhea.  She was given a course of clindamycin for possible soft tissue infection, however per chart review etiology of symptoms was suspected to be GI in nature.  Today, patient states fever has worsened in addition to nausea, chills, diarrhea.  She also endorses severely dry mouth not relieved with PO fluid intake.  Patient states that she has not urinated in at least 2 or 3 days.  No history of autoimmune disease or personal or family history of kidney disease.  Dizziness Associated symptoms: no headaches, no shortness of breath and no vomiting   Fever Associated symptoms: no headaches, no rash and no vomiting       Home Medications Prior to Admission medications   Medication Sig Start Date End Date Taking? Authorizing Provider  clindamycin (CLEOCIN) 300 MG capsule Take 1 capsule (300 mg total) by mouth 3 (three) times daily for 5 days. 02/07/21 02/12/21  Barrett Henle, MD  ondansetron (ZOFRAN-ODT) 4 MG disintegrating tablet Take 1 tablet (4 mg total) by mouth every 8 (eight) hours as needed for nausea or vomiting. 02/07/21   Barrett Henle, MD      Allergies    Penicillins    Review of Systems   Review of Systems  Constitutional:  Positive for fever.  HENT: Negative.    Eyes: Negative.   Respiratory:   Negative for shortness of breath.   Cardiovascular: Negative.   Gastrointestinal:  Negative for abdominal pain and vomiting.  Endocrine: Negative.   Genitourinary: Negative.   Musculoskeletal: Negative.   Skin:  Negative for rash.  Neurological:  Positive for dizziness. Negative for headaches.  All other systems reviewed and are negative.  Physical Exam Updated Vital Signs BP (!) 106/57 (BP Location: Right Arm)    Pulse (!) 130    Temp (!) 102.1 F (38.9 C)    Resp 18    Ht 5\' 6"  (1.676 m)    Wt 110 kg    LMP 01/18/2021    SpO2 98%    BMI 39.14 kg/m  Physical Exam Vitals and nursing note reviewed.  Constitutional:      General: She is not in acute distress.    Appearance: She is ill-appearing and toxic-appearing.     Comments: Obese, 21 year old ill-appearing female  HENT:     Head: Atraumatic.     Mouth/Throat:     Comments: Lips are dry, cracking.  Oromucosa is dry, thrush noted on tongue Eyes:     Conjunctiva/sclera: Conjunctivae normal.  Cardiovascular:     Rate and Rhythm: Regular rhythm. Tachycardia present.     Pulses: Normal pulses.     Heart sounds: No murmur heard. Pulmonary:     Effort: Pulmonary effort is normal. No respiratory distress.  Breath sounds: Normal breath sounds.  Abdominal:     General: Abdomen is flat. There is no distension.     Palpations: Abdomen is soft.     Tenderness: There is no abdominal tenderness.  Musculoskeletal:        General: Normal range of motion.     Cervical back: Normal range of motion.  Skin:    General: Skin is warm and dry.     Capillary Refill: Capillary refill takes less than 2 seconds.     Comments: Tattoo on posterior right thigh without evidence of infection.  Areas slightly red consistent with newly healed tattoo injury.  No induration, drainage or erythema.  Neurological:     General: No focal deficit present.     Mental Status: She is alert.  Psychiatric:        Mood and Affect: Mood normal.    ED Results  / Procedures / Treatments   Labs (all labs ordered are listed, but only abnormal results are displayed) Labs Reviewed  RESP PANEL BY RT-PCR (FLU A&B, COVID) [id number]  CULTURE, BLOOD (ROUTINE X 2)  CULTURE, BLOOD (ROUTINE X 2)  URINE CULTURE  LACTIC ACID, PLASMA  LACTIC ACID, PLASMA  COMPREHENSIVE METABOLIC PANEL  CBC WITH DIFFERENTIAL/PLATELET  PROTIME-INR  APTT  URINALYSIS, ROUTINE W REFLEX MICROSCOPIC  I-STAT BETA HCG BLOOD, ED ([facility], [facility], [facility] ONLY)    EKG None  Radiology DG Chest 1 View  Result Date: 02/07/2021 CLINICAL DATA:  21 year old female with fever of unknown origin. EXAM: CHEST  1 VIEW COMPARISON:  None. FINDINGS: PA view at 0520 hours. Normal lung volumes and mediastinal contours. Visualized tracheal air column is within normal limits. Mild hair artifact. Lung markings are within normal limits, both lungs appear clear. No pneumothorax or pleural effusion. Negative visible bowel gas and osseous structures. IMPRESSION: Negative.  No cardiopulmonary abnormality. Electronically Signed   By: Genevie Ann M.D.   On: 02/07/2021 06:24   DG Chest Port 1 View  Result Date: 02/08/2021 CLINICAL DATA:  Questionable sepsis EXAM: PORTABLE CHEST 1 VIEW COMPARISON:  February 07, 2021 FINDINGS: The heart size and mediastinal contours are within normal limits. Both lungs are clear. The visualized skeletal structures are unremarkable. IMPRESSION: No active disease. Electronically Signed   By: Dahlia Bailiff M.D.   On: 02/08/2021 15:31    Procedures .Critical Care Performed by: Tonye Pearson, PA-C Authorized by: Tonye Pearson, PA-C   Critical care provider statement:    Critical care time (minutes):  30   Critical care start time:  02/08/2021 3:45 PM   Critical care end time:  02/08/2021 4:15 PM   Critical care was necessary to treat or prevent imminent or life-threatening deterioration of the following conditions:  Renal failure, shock, endocrine crisis and dehydration   Critical care was  time spent personally by me on the following activities:  Development of treatment plan with patient or surrogate, discussions with consultants, evaluation of patient's response to treatment, examination of patient, ordering and review of laboratory studies, ordering and review of radiographic studies, ordering and performing treatments and interventions, pulse oximetry, re-evaluation of patient's condition and review of old charts   I assumed direction of critical care for this patient from another provider in my specialty: no     Care discussed with: admitting provider     Medications Ordered in ED Medications  lactated ringers infusion (has no administration in time range)  clotrimazole (MYCELEX) troche 10 mg (has no administration in time range)  acetaminophen (TYLENOL) tablet 650 mg (650 mg Oral Given 02/08/21 1509)    ED Course/ Medical Decision Making/ A&P Clinical Course as of 02/08/21 2158  Tue Feb 08, 2021  1600 DG Chest [place] 1 View Chest x-ray normal [EC]  1725 CBC WITH DIFFERENTIAL CBC normal without evidence of systemic infection or anemia [EC]  1738 Resp Panel by RT-PCR (Flu A&B, Covid) Nasopharyngeal Swab Respiratory panel negative [EC]  1739 Lactic acid, plasma(!!) Lactic acid elevated at 2.5 [EC]  [id number] Basic metabolic panel(!) BMP very concerning for creatinine of 5.04, BUN of 54.  Potassium low, GFR of 12.  Anion gap is normal. [EC]  2000 Patient unable to provide a urine sample despite adequate amounts of urine seen on bladder scan.  RN to place Foley catheter to obtain urine sample. [EC]  2035 BP(!): 101/34 [EC]  2151 CT ABDOMEN PELVIS WO CONTRAST CT abdomen without evidence of hydronephrosis, obstructive renal pathology.  There is some renal fat stranding, possibly consistent with UTI. [EC]    Clinical Course User Index [EC] Tonye Pearson, PA-C                           Medical Decision Making Amount and/or Complexity of Data Reviewed Labs: ordered.  Decision-making details documented in ED Course. Radiology: ordered. Decision-making details documented in ED Course.  Risk OTC drugs. Prescription drug management. Decision regarding hospitalization.   History:   Beverly Clark is a 21 y.o. female who presents to the ED for evaluation of fevers, nausea, chills and body aches that has been ongoing since 02/06/2021.  Patient first went to the ED that day where it was suspected that she had a viral URI.  She was concerned at that time for possible infection of a recently placed tattoo on 02/05/2021, however the tattoo did not seem infected per previous exams.  She then went to urgent care yesterday as her symptoms progressed into nausea and diarrhea.  She was given a course of clindamycin for possible soft tissue infection, however per chart review etiology of symptoms was suspected to be GI in nature.  Today, patient states fever has worsened in addition to nausea, chills, diarrhea.  She also endorses severely dry mouth not relieved with PO fluid intake.  Patient states that she has not urinated in at least 2 or 3 days.  No history of autoimmune disease or personal or family history of kidney disease. Additional history obtained from mother External records from outside source obtained and reviewed including urgent care record and ED dispo from 02/06/21 Details: Patient seen twice previously for fevers and diarrhea.  Suspect for viral illness.  She was started on clindamycin yesterday by urgent care out of abundance of caution for infected tattoo. This patient presents to the ED for concern of fever and diarrhea, this involves an extensive number of treatment options, and is a complaint that carries with it a high risk of complications and morbidity.   The differential diagnosis for this complaint includes sepsis, thyrotoxicosis, adrenal insufficiency, salicylate toxicity, AKI, pyelonephritis, hydronephrosis, renal obstruction, severe dehydration,  gastroenteritis, viral URI  Initial impression:  Patient is ill-appearing and appears systemically toxic.  Vitals with heart rate consistently in the 120s, respiratory rate at 27, blood pressures soft at around 95 systolic.  Patient's tattoo in her posterior thigh does not appear infected.  There is no clear source for what seems to be a systemic infection, although patient appears significantly dry on  exam.  She has developed some angular cheilitis along with oral thrush.  I will treat for thrush and proceed with broad work-up to evaluate for patient's cause of illness.  Lab Tests and EKG:  I Ordered, reviewed, and interpreted labs and EKG.  The pertinent results in my decision-making regarding them are detailed in the ED course and/or initial impression section above.   Imaging Studies ordered:  I ordered imaging studies including CT abdomen pelvis, renal ultrasound, chest x-ray I independently visualized and interpreted imaging and I agree with the radiologist interpretation. Decisions made regarding results are detailed in the ED course and/or initial impression section above.   Cardiac Monitoring:  The patient was maintained on a cardiac monitor.  I personally viewed and interpreted the cardiac monitored which showed an underlying rhythm of: Sinus tachycardia   Medicines ordered and prescription drug management:  I ordered medication including: 2 L lactated ringer bolus for rehydration Cefepime 2 g and Flagyl 500 mg IV for suspected infection Potassium 40 mill equivalents p.o. for hypokalemia Clotrimazole troches 10 mg for oral thrush Reevaluation of the patient after these medicines showed that the patient stayed the same I have reviewed the patients home medicines and have made adjustments as needed   Critical Interventions:  Interventions to prevent renal failure and severe dehydration as described above.   Disposition:  After consideration of the diagnostic results,  physical exam, history and the patients response to treatment feel that the patent would benefit from admission.   Acute kidney failure: Given patient's toxic appearance and alarming kidney function values, she likely would benefit from admission with more intensive treatment and management.  We are currently pending results from her urinalysis.  She was unable to produce urine for [place] despite full bladder seen on ultrasound, so we will place a Foley catheter.  We started her on broad-spectrum antibiotics while here in the ED.  This case was closely discussed with my attending Dr. Lana Fish who agreed with work-up and treatment.  I spoke with Dr. Roel Cluck who agrees to admit patient.  Ultimate treatment and disposition to be determined by hospitalist team.   Final Clinical Impression(s) / ED Diagnoses Final diagnoses:  Urinary (tract) obstruction    Rx / DC Orders ED Discharge Orders     None         Rodena Piety 02/08/21 2258    Teressa Lower, MD 02/09/21 0003

## 2021-02-08 NOTE — Assessment & Plan Note (Signed)
-  SIRS criteria met with   tachycardia   fever  RR >20 Today's Vitals   02/08/21 1902 02/08/21 1902 02/08/21 1915 02/08/21 2015  BP:   (!) 101/34   Pulse:   (!) 120 (!) 125  Resp:   (!) 25 (!) 27  Temp: 98.5 F (36.9 C)     TempSrc: Oral     SpO2:   96% 97%  Weight:      Height:      PainSc:  4      Body mass index is 39.14 kg/m.   The recent clinical data is shown below. Vitals:   02/08/21 1830 02/08/21 1902 02/08/21 1915 02/08/21 2015  BP: (!) 102/50  (!) 101/34   Pulse: (!) 117  (!) 120 (!) 125  Resp: (!) 26  (!) 25 (!) 27  Temp:  98.5 F (36.9 C)    TempSrc:  Oral    SpO2: 98%  96% 97%  Weight:      Height:        Source of sepsis is unknown but given clinical picture will continue to treat   Patient meeting criteria for Severe sepsis with    evidence of end organ damage/organ dysfunction such as   Acute Kidney Injury with Cr > 2,  Lab Results  Component Value Date   CREATININE 4.97 (H) 02/08/2021   CREATININE 5.04 (H) 02/08/2021    elevated lactic acid >2     Component Value Date/Time   LATICACIDVEN 2.6 (HH) 02/08/2021 2051      SBP<90 mmhg or MAP < 65 mmhg,      - Obtain serial lactic acid and procalcitonin level.  - Initiated IV antibiotics in ER: Antibiotics Given (last 72 hours)    Date/Time Action Medication Dose Rate   02/08/21 1941 New Bag/Given   ceFEPIme (MAXIPIME) 2 g in sodium chloride 0.9 % 100 mL IVPB 2 g 200 mL/hr   02/08/21 2006 New Bag/Given   metroNIDAZOLE (FLAGYL) IVPB 500 mg 500 mg 100 mL/hr     Will continue     - await results of blood and urine culture  - Rehydrate aggressively  Intravenous fluids were administered   30cc/kg fluid   10:14 PM

## 2021-02-08 NOTE — Assessment & Plan Note (Signed)
Rehydrate aggressively and follow fluid status

## 2021-02-08 NOTE — ED Notes (Signed)
EDP and PA-C at the bedside for [place] of pt's bladder.

## 2021-02-08 NOTE — Progress Notes (Signed)
Pharmacy Antibiotic Note  Beverly Clark is a 21 y.o. female admitted on 02/08/2021 with fever, recent tattoo on posterior thigh that is red.  Pharmacy has been consulted to dose cefepime for sepsis  Plan: Cefepime 2gm IV q24h Follow renal function and clinical course  Height: 5\' 6"  (167.6 cm) Weight: 110 kg (242 lb 8.1 oz) IBW/kg (Calculated) : 59.3  Temp (24hrs), Avg:101.1 F (38.4 C), Min:98.5 F (36.9 C), Max:102.6 F (39.2 C)  Recent Labs  Lab 02/08/21 1723 02/08/21 2040 02/08/21 2051  WBC 9.6 6.6  --   CREATININE 5.04* 4.97*  --   LATICACIDVEN 2.5*  --  2.6*    Estimated Creatinine Clearance: 22.3 mL/min (A) (by C-G formula based on SCr of 4.97 mg/dL (H)).    Allergies  Allergen Reactions   Penicillins Hives     Thank you for allowing pharmacy to be a part of this patients care.  2052 RPh 02/08/2021, 10:21 PM

## 2021-02-08 NOTE — Progress Notes (Signed)
A consult was received from an ED physician for cefepime and flagyl per pharmacy dosing.  The patient's profile has been reviewed for ht/wt/allergies/indication/available labs.  Allergy to penicillin of hives (childhood allergy per d/w MD) - given low cross-reactivity between PCN and cephalosporins, will proceed with cefepime use.    A one time order has been placed for cefepime 2g IV x1 and flagyl 500mg  IV x1.  Further antibiotics/pharmacy consults should be ordered by admitting physician if indicated.                       Thank you,  Dimple Nanas, PharmD 02/08/2021 7:17 PM

## 2021-02-08 NOTE — ED Triage Notes (Addendum)
Patient has a tattoo to the right posterior thigh that she got on 02/05/21. Tattoo area is red .Patient has a T. 102.1 in triage. Patient took Tylenol at 0630 this AM.

## 2021-02-08 NOTE — Assessment & Plan Note (Signed)
In the setting of dehydration Will rehydrate obtain urine electrolytes monitor check TSH

## 2021-02-08 NOTE — Assessment & Plan Note (Addendum)
-    evidence of acute renal failure due to presence of following: Cr increased >0.3 from baseline likely secondary to dehydration,        check FeNA       Rehydrate with IV fluids  History does not suggest urinary retention or obstruction       Given severity  obtained renal US no hydro Slightly echogenic kidneys For completion ordered inflammatory markers Discussed with Dr. Posey Pronto with nephrology He recommends: Hydrate her well overnight and see what labs do in the morning to decide if she needs a formal consult inpatient or can be seen electively as an outpatient. Kidney size is large with normal hgb and phos/Ca- so I doubt this is truly chronic

## 2021-02-08 NOTE — ED Notes (Signed)
Lactic 2.5. Received from lab.

## 2021-02-08 NOTE — Assessment & Plan Note (Signed)
Only had about 4 bowel movements currently improved with Imodium.  If repeat obtain gastric panel checked today for completion. CT scan unremarkable no evidence of colitis

## 2021-02-08 NOTE — Assessment & Plan Note (Signed)
We will replace and follow °

## 2021-02-08 NOTE — Subjective & Objective (Signed)
Fever, chills, body aches, have been seen in ER fo r the 3rd time

## 2021-02-08 NOTE — Assessment & Plan Note (Signed)
Replace and follow. ?

## 2021-02-08 NOTE — ED Provider Triage Note (Signed)
Emergency Medicine Provider Triage Evaluation Note  Beverly Clark , a 21 y.o. female  was evaluated in triage.  Pt complains of generalized body aches, fever.  Symptoms been going on for several days.  She is concerned about infected tattoo that she got on Saturday.  Symptoms started 2 days ago.  Started on antibiotics yesterday, has not had less than 24 hours.  Did have several episodes of diarrhea last night, none today.  No cough, abdominal pain, urinary symptoms.  Review of Systems  Positive: Fever, body aches Negative: Abd pain  Physical Exam  BP (!) 106/57 (BP Location: Right Arm)    Pulse (!) 130    Temp (!) 102.1 F (38.9 C)    Resp 18    Ht 5\' 6"  (1.676 m)    Wt 110 kg    LMP 01/18/2021    SpO2 98%    BMI 39.14 kg/m  Gen:   Awake, no distress   Resp:  Normal effort  MSK:   Moves extremities without difficulty  Other:  Mild warmth and erythema surrounding the tattoo of the right posterior thigh.  No purulence.  No significant tenderness. Patient is tachycardic.  Mucous membranes dry  Medical Decision Making  Medically screening exam initiated at 3:09 PM.  Appropriate orders placed.  Marelyn L Goda was informed that the remainder of the evaluation will be completed by another provider, this initial triage assessment does not replace that evaluation, and the importance of remaining in the ED until their evaluation is complete.  Sepsis labs   Shon Baton, PA-C 02/08/21 1516

## 2021-02-08 NOTE — ED Triage Notes (Signed)
Per, EMS-states she was seen for same symptoms yesterday and night before-flu like symptoms-hypotensive with EMS-150 cc of NS given in route

## 2021-02-08 NOTE — ED Notes (Signed)
Pt is a difficult stick despite multiple attempts. IV team consult placed at this time.

## 2021-02-09 ENCOUNTER — Inpatient Hospital Stay (HOSPITAL_COMMUNITY): Payer: Medicaid Other

## 2021-02-09 DIAGNOSIS — R0602 Shortness of breath: Secondary | ICD-10-CM

## 2021-02-09 DIAGNOSIS — L818 Other specified disorders of pigmentation: Secondary | ICD-10-CM

## 2021-02-09 DIAGNOSIS — R591 Generalized enlarged lymph nodes: Secondary | ICD-10-CM

## 2021-02-09 DIAGNOSIS — E872 Acidosis, unspecified: Secondary | ICD-10-CM | POA: Diagnosis present

## 2021-02-09 DIAGNOSIS — R748 Abnormal levels of other serum enzymes: Secondary | ICD-10-CM

## 2021-02-09 DIAGNOSIS — Z978 Presence of other specified devices: Secondary | ICD-10-CM

## 2021-02-09 DIAGNOSIS — J189 Pneumonia, unspecified organism: Secondary | ICD-10-CM

## 2021-02-09 LAB — GASTROINTESTINAL PANEL BY PCR, STOOL (REPLACES STOOL CULTURE)

## 2021-02-09 LAB — CBC WITH DIFFERENTIAL/PLATELET
Abs Immature Granulocytes: 0.04 10*3/uL (ref 0.00–0.07)
Basophils Absolute: 0 10*3/uL (ref 0.0–0.1)
Basophils Relative: 1 %
Eosinophils Absolute: 0.2 10*3/uL (ref 0.0–0.5)
Eosinophils Relative: 2 %
HCT: 37.1 % (ref 36.0–46.0)
Hemoglobin: 12.5 g/dL (ref 12.0–15.0)
Immature Granulocytes: 1 %
Lymphocytes Relative: 5 %
Lymphs Abs: 0.4 10*3/uL — ABNORMAL LOW (ref 0.7–4.0)
MCH: 29.8 pg (ref 26.0–34.0)
MCHC: 33.7 g/dL (ref 30.0–36.0)
MCV: 88.3 fL (ref 80.0–100.0)
Monocytes Absolute: 0.1 10*3/uL (ref 0.1–1.0)
Monocytes Relative: 2 %
Neutro Abs: 6.2 10*3/uL (ref 1.7–7.7)
Neutrophils Relative %: 89 %
Platelets: 153 10*3/uL (ref 150–400)
RBC: 4.2 MIL/uL (ref 3.87–5.11)
RDW: 13.3 % (ref 11.5–15.5)
WBC: 7 10*3/uL (ref 4.0–10.5)
nRBC: 0 % (ref 0.0–0.2)

## 2021-02-09 LAB — COMPREHENSIVE METABOLIC PANEL
ALT: 36 U/L (ref 0–44)
AST: 27 U/L (ref 15–41)
Albumin: 2.7 g/dL — ABNORMAL LOW (ref 3.5–5.0)
Alkaline Phosphatase: 50 U/L (ref 38–126)
Anion gap: 11 (ref 5–15)
BUN: 41 mg/dL — ABNORMAL HIGH (ref 6–20)
CO2: 19 mmol/L — ABNORMAL LOW (ref 22–32)
Calcium: 8.1 mg/dL — ABNORMAL LOW (ref 8.9–10.3)
Chloride: 103 mmol/L (ref 98–111)
Creatinine, Ser: 2.08 mg/dL — ABNORMAL HIGH (ref 0.44–1.00)
GFR, Estimated: 34 mL/min — ABNORMAL LOW (ref >60)
Glucose, Bld: 108 mg/dL — ABNORMAL HIGH (ref 70–99)
Potassium: 3.6 mmol/L (ref 3.5–5.1)
Sodium: 133 mmol/L — ABNORMAL LOW (ref 135–145)
Total Bilirubin: 2.4 mg/dL — ABNORMAL HIGH (ref 0.3–1.2)
Total Protein: 5.8 g/dL — ABNORMAL LOW (ref 6.5–8.1)

## 2021-02-09 LAB — HEPATIC FUNCTION PANEL
ALT: 35 U/L (ref 0–44)
AST: 28 U/L (ref 15–41)
Albumin: 2.6 g/dL — ABNORMAL LOW (ref 3.5–5.0)
Alkaline Phosphatase: 59 U/L (ref 38–126)
Bilirubin, Direct: 1.4 mg/dL — ABNORMAL HIGH (ref 0.0–0.2)
Indirect Bilirubin: 1.3 mg/dL — ABNORMAL HIGH (ref 0.3–0.9)
Total Bilirubin: 2.7 mg/dL — ABNORMAL HIGH (ref 0.3–1.2)
Total Protein: 5.8 g/dL — ABNORMAL LOW (ref 6.5–8.1)

## 2021-02-09 LAB — PHOSPHORUS: Phosphorus: 1.8 mg/dL — ABNORMAL LOW (ref 2.5–4.6)

## 2021-02-09 LAB — RESPIRATORY PANEL BY PCR

## 2021-02-09 LAB — BASIC METABOLIC PANEL
Anion gap: 11 (ref 5–15)
Anion gap: 8 (ref 5–15)
Anion gap: 9 (ref 5–15)
BUN: 53 mg/dL — ABNORMAL HIGH (ref 6–20)
BUN: 28 mg/dL — ABNORMAL HIGH (ref 6–20)
BUN: 22 mg/dL — ABNORMAL HIGH (ref 6–20)
CO2: 20 mmol/L — ABNORMAL LOW (ref 22–32)
CO2: 20 mmol/L — ABNORMAL LOW (ref 22–32)
CO2: 20 mmol/L — ABNORMAL LOW (ref 22–32)
Calcium: 7.7 mg/dL — ABNORMAL LOW (ref 8.9–10.3)
Calcium: 8.1 mg/dL — ABNORMAL LOW (ref 8.9–10.3)
Calcium: 8.3 mg/dL — ABNORMAL LOW (ref 8.9–10.3)
Chloride: 100 mmol/L (ref 98–111)
Chloride: 103 mmol/L (ref 98–111)
Chloride: 105 mmol/L (ref 98–111)
Creatinine, Ser: 3.1 mg/dL — ABNORMAL HIGH (ref 0.44–1.00)
Creatinine, Ser: 1.24 mg/dL — ABNORMAL HIGH (ref 0.44–1.00)
Creatinine, Ser: 1.07 mg/dL — ABNORMAL HIGH (ref 0.44–1.00)
GFR, Estimated: 21 mL/min — ABNORMAL LOW (ref >60)
GFR, Estimated: >60 mL/min (ref >60)
GFR, Estimated: >60 mL/min (ref >60)
Glucose, Bld: 85 mg/dL (ref 70–99)
Glucose, Bld: 102 mg/dL — ABNORMAL HIGH (ref 70–99)
Glucose, Bld: 87 mg/dL (ref 70–99)
Potassium: 3.3 mmol/L — ABNORMAL LOW (ref 3.5–5.1)
Potassium: 3.4 mmol/L — ABNORMAL LOW (ref 3.5–5.1)
Potassium: 3.7 mmol/L (ref 3.5–5.1)
Sodium: 131 mmol/L — ABNORMAL LOW (ref 135–145)
Sodium: 131 mmol/L — ABNORMAL LOW (ref 135–145)
Sodium: 134 mmol/L — ABNORMAL LOW (ref 135–145)

## 2021-02-09 LAB — LACTIC ACID, PLASMA
Lactic Acid, Venous: 2.3 mmol/L (ref 0.5–1.9)
Lactic Acid, Venous: 2.7 mmol/L (ref 0.5–1.9)
Lactic Acid, Venous: 2.9 mmol/L (ref 0.5–1.9)
Lactic Acid, Venous: 3.8 mmol/L (ref 0.5–1.9)
Lactic Acid, Venous: 5 mmol/L (ref 0.5–1.9)

## 2021-02-09 LAB — PREALBUMIN: Prealbumin: 7.4 mg/dL — ABNORMAL LOW (ref 18–38)

## 2021-02-09 LAB — C-REACTIVE PROTEIN: CRP: 30.3 mg/dL — ABNORMAL HIGH (ref <1.0)

## 2021-02-09 LAB — C DIFFICILE QUICK SCREEN W PCR REFLEX
C Diff antigen: NEGATIVE
C Diff interpretation: NOT DETECTED
C Diff toxin: NEGATIVE

## 2021-02-09 LAB — OSMOLALITY, URINE: Osmolality, Ur: 335 mOsm/kg (ref 300–900)

## 2021-02-09 LAB — OSMOLALITY: Osmolality: 285 mOsm/kg (ref 275–295)

## 2021-02-09 LAB — PROTIME-INR
INR: 1.1 (ref 0.8–1.2)
Prothrombin Time: 14.2 seconds (ref 11.4–15.2)

## 2021-02-09 LAB — STREP PNEUMONIAE URINARY ANTIGEN: Strep Pneumo Urinary Antigen: NEGATIVE

## 2021-02-09 LAB — MAGNESIUM: Magnesium: 1.9 mg/dL (ref 1.7–2.4)

## 2021-02-09 MED ORDER — SODIUM CHLORIDE 0.9 % IV SOLN
2.0000 g | INTRAVENOUS | Status: DC
  Administered 2021-02-09 – 2021-02-11 (×3): 2 g via INTRAVENOUS
  Filled 2021-02-09 (×3): qty 20

## 2021-02-09 MED ORDER — LACTATED RINGERS IV SOLN: INTRAVENOUS | Status: AC

## 2021-02-09 MED ORDER — LEVALBUTEROL HCL 0.63 MG/3ML IN NEBU
0.6300 mg | INHALATION_SOLUTION | Freq: Four times a day (QID) | RESPIRATORY_TRACT | Status: DC | PRN
  Administered 2021-02-10: 0.63 mg via RESPIRATORY_TRACT
  Filled 2021-02-09: qty 3

## 2021-02-09 MED ORDER — SODIUM CHLORIDE 0.9 % IV BOLUS
500.0000 mL | Freq: Once | INTRAVENOUS | Status: AC
Start: 2021-02-09 — End: 2021-02-09
  Administered 2021-02-09: 03:00:00 500 mL via INTRAVENOUS

## 2021-02-09 MED ORDER — ENOXAPARIN SODIUM 60 MG/0.6ML IJ SOSY
60.0000 mg | PREFILLED_SYRINGE | INTRAMUSCULAR | Status: DC
  Administered 2021-02-09 – 2021-02-10 (×2): 60 mg via SUBCUTANEOUS
  Filled 2021-02-09 (×2): qty 0.6

## 2021-02-09 MED ORDER — CHLORHEXIDINE GLUCONATE CLOTH 2 % EX PADS
6.0000 | MEDICATED_PAD | Freq: Every day | CUTANEOUS | Status: DC
  Administered 2021-02-09: 19:00:00 6 via TOPICAL

## 2021-02-09 MED ORDER — LACTATED RINGERS IV BOLUS
1000.0000 mL | Freq: Once | INTRAVENOUS | Status: AC
  Administered 2021-02-09: 14:00:00 1000 mL via INTRAVENOUS

## 2021-02-09 MED ORDER — POTASSIUM CHLORIDE 10 MEQ/100ML IV SOLN
10.0000 meq | INTRAVENOUS | Status: AC
  Administered 2021-02-09 (×2): 10 meq via INTRAVENOUS
  Filled 2021-02-09 (×2): qty 100

## 2021-02-09 MED ORDER — ALBUTEROL SULFATE (2.5 MG/3ML) 0.083% IN NEBU: 2.5000 mg | INHALATION_SOLUTION | RESPIRATORY_TRACT | Status: DC | PRN

## 2021-02-09 MED ORDER — SODIUM CHLORIDE 0.9 % IV SOLN
2.0000 g | Freq: Two times a day (BID) | INTRAVENOUS | Status: DC
  Administered 2021-02-09: 12:00:00 2 g via INTRAVENOUS
  Filled 2021-02-09 (×2): qty 2

## 2021-02-09 MED ORDER — AZITHROMYCIN 250 MG PO TABS
500.0000 mg | ORAL_TABLET | Freq: Every day | ORAL | Status: DC
  Administered 2021-02-09 – 2021-02-11 (×3): 500 mg via ORAL
  Filled 2021-02-09 (×3): qty 2

## 2021-02-09 NOTE — Assessment & Plan Note (Signed)
Negative GI path panel and C diff

## 2021-02-09 NOTE — Assessment & Plan Note (Signed)
Reactive? Follow outpatient

## 2021-02-09 NOTE — Progress Notes (Signed)
PROGRESS NOTE    Beverly Clark  GMW:[id number] DOB: 09-Feb-2000 DOA: 02/08/2021 PCP: Pcp, No  Chief Complaint  Patient presents with   Dizziness   Fever    Brief Narrative:  21 yo who presented with fevers, chills, diarrhea initially treated for presumed infected tattoo.  Now she's returned with AKI, shortness of breath, and imaging findings concerning for pneumonia.  See below for additional details    Assessment & Plan:   Principal Problem:   Sepsis due to pneumonia [facility]) Active Problems:   AKI (acute kidney injury) ([place])   Shortness of breath   Tattoo of skin   Hyponatremia   Hypokalemia   Dehydration   Hypomagnesemia   Diarrhea   Elevated liver enzymes   Lactic acidosis   Lymphadenopathy   Foley catheter in place   * Sepsis due to pneumonia ([place]) Fever, tachycardia, tachypnea At this time, with CT scan concerning for bronchopneumonia or aspiration -> community acquired pneumonia seems to be most likely cause for her presentation with fever and shortness of breath RVP negative Negative covid, influenza MRSA PCR pending Negative HIV.  Negative UDS.  TSH wnl. Elevated ESR, CRP Pending ANA UA not c/w UTI, follow culture Blood cultures pending Will narrow to ceftriaxone and flagyl.  Add azithromycin. Procalcitonin is significantly elevated, 19.2, supports concern for bacterial infection.  WBC is normal, which is surprising given her acute illness.     AKI (acute kidney injury) ([place])- (present on admission) Creatinine peaked at 5 Improving rapidly with IVF Continue to follow I/O UA with 30 mg/dl protein, 6-10 RBC's, 21-50 wbc's  Tattoo of skin Potential source of infection on right thigh Mild erythema/warmth, but no pain -> suspect this is less likely source, consider broadening abx  Shortness of breath Due to pneumonia CT as above Trend CXR, follow echo  Consider additional imaging, though at this time, likely all related to pneumonia  Elevated  liver enzymes Hemodynamically mediated? Shock liver? Bili mildly elevated (no biliary dilatation on CT) If not improving, follow RUQ [place]  Diarrhea- (present on admission) Negative GI path panel and C diff  Hypomagnesemia- (present on admission) improved  Hypokalemia- (present on admission) improved  Hyponatremia- (present on admission) improving  Lactic acidosis- (present on admission) Will trend, improving with IVF  Lymphadenopathy Reactive? Follow outpatient  Foley catheter in place Placed in ED, consider d/c in AM   DVT prophylaxis: lovenox Code Status: full Family Communication: mother Disposition:   Status is: Inpatient  Remains inpatient appropriate because: need for IV abx       Consultants:  none  Procedures:  none  Antimicrobials:  Anti-infectives (From admission, onward)    Start     Dose/Rate Route Frequency Ordered Stop   02/09/21 2045  cefTRIAXone (ROCEPHIN) 2 g in sodium chloride 0.9 % 100 mL IVPB        2 g 200 mL/hr over 30 Minutes Intravenous Every 24 hours 02/09/21 1945 02/14/21 2044   02/09/21 2000  ceFEPIme (MAXIPIME) 2 g in sodium chloride 0.9 % 100 mL IVPB  Status:  Discontinued        2 g 200 mL/hr over 30 Minutes Intravenous Every 24 hours 02/08/21 2217 02/09/21 1027   02/09/21 1130  azithromycin (ZITHROMAX) tablet 500 mg        500 mg Oral Daily 02/09/21 1117     02/09/21 1030  ceFEPIme (MAXIPIME) 2 g in sodium chloride 0.9 % 100 mL IVPB  Status:  Discontinued  2 g 200 mL/hr over 30 Minutes Intravenous Every 12 hours 02/09/21 1027 02/09/21 1945   02/09/21 0800  metroNIDAZOLE (FLAGYL) IVPB 500 mg        500 mg 100 mL/hr over 60 Minutes Intravenous Every 12 hours 02/08/21 2203     02/08/21 1930  ceFEPIme (MAXIPIME) 2 g in sodium chloride 0.9 % 100 mL IVPB        2 g 200 mL/hr over 30 Minutes Intravenous  Once 02/08/21 1917 02/08/21 2006   02/08/21 1930  metroNIDAZOLE (FLAGYL) IVPB 500 mg        500 mg 100 mL/hr over 60  Minutes Intravenous  Once 02/08/21 1917 02/08/21 2120       Subjective: C/o chills, nausea Body aches  Objective: Vitals:   02/09/21 1525 02/09/21 1603 02/09/21 1606 02/09/21 1805  BP: 116/88 (!) 141/79 (!) 141/79 (!) 108/39  Pulse: (!) 116 (!) 116 (!) 117 (!) 120  Resp: (!) 38 (!) _0 Temp: 97.9 F (36.6 C) 98.3 F (36.8 C) 98.3 F (36.8 C) 98.7 F (37.1 C)  TempSrc: Oral Oral Oral Oral  SpO2: 97% 97% 97% 100%  Weight:  121.7 kg    Height:  5' 6" (1.676 m)      Intake/Output Summary (Last 24 hours) at 02/09/2021 1947 Last data filed at 02/09/2021 1747 Gross per 24 hour  Intake 2743.68 ml  Output 5000 ml  Net -2256.32 ml   Filed Weights   02/08/21 1502 02/09/21 1603  Weight: 110 kg 121.7 kg    Examination:  General exam: Appears fatigued and short of breath, can barely sit up Respiratory system: diminished, short of breath, speaking in 2-3 word sentenences Cardiovascular system: regular, tachy Gastrointestinal system: Abdomen is nondistended, soft and nontender.  Central nervous system: Alert and oriented. No focal neurological deficits. Extremities: right thigh tattoo with mild redness, no significant ttp, possibly mild warmth, though similar Skin: No rashes, lesions or ulcers Psychiatry: Judgement and insight appear normal. Mood & affect appropriate.     Data Reviewed: I have personally reviewed following labs and imaging studies  CBC: Recent Labs  Lab 02/08/21 1723 02/08/21 2040 02/09/21 0536  WBC 9.6 6.6 7.0  NEUTROABS 7.7 5.9 6.2  HGB 13.1 12.9 12.5  HCT 39.4 37.6 37.1  MCV 89.5 87.4 88.3  PLT 184 174 979    Basic Metabolic Panel: Recent Labs  Lab 02/08/21 2040 02/09/21 0102 02/09/21 0536 02/09/21 1130 02/09/21 1642  NA 128* 131* 133* 131* 134*  K 3.3* 3.3* 3.6 3.4* 3.7  CL 97* 100 103 103 105  CO2 18* 20* 19* 20* 20*  GLUCOSE 81 85 108* 102* 87  BUN 57* 53* 41* 28* 22*  CREATININE 4.97* 3.10* 2.08* 1.24* 1.07*  CALCIUM 7.7*  7.7* 8.1* 8.1* 8.3*  MG 1.3*  --  1.9  --   --   PHOS 3.5  --  1.8*  --   --     GFR: Estimated Creatinine Clearance: 109.8 mL/min (A) (by C-G formula based on SCr of 1.07 mg/dL (H)).  Liver Function Tests: Recent Labs  Lab 02/08/21 1723 02/08/21 2040 02/09/21 0536 02/09/21 1642  AST _1 ALT 39 38 36 35  ALKPHOS 48 48 50 59  BILITOT 2.6* 2.6* 2.4* 2.7*  PROT 6.3* 6.2* 5.8* 5.8*  ALBUMIN 2.9* 2.8* 2.7* 2.6*    CBG: No results for input(s): GLUCAP in the last 168 hours.   Recent Results (from the past 240  hour(s))  Resp Panel by RT-PCR (Flu A&B, Covid) Nasopharyngeal Swab     Status: None   Collection Time: 02/06/21  6:26 PM   Specimen: Nasopharyngeal Swab; Nasopharyngeal(NP) swabs in vial transport medium  Result Value Ref Range Status   SARS Coronavirus 2 by RT PCR NEGATIVE NEGATIVE Final    Comment: (NOTE) SARS-CoV-2 target nucleic acids are NOT DETECTED.  The SARS-CoV-2 RNA is generally detectable in upper respiratory specimens during the acute phase of infection. The lowest concentration of SARS-CoV-2 viral copies this assay can detect is 138 copies/mL. A negative result does not preclude SARS-Cov-2 infection and should not be used as the sole basis for treatment or other patient management decisions. A negative result may occur with  improper specimen collection/handling, submission of specimen other than nasopharyngeal swab, presence of viral mutation(s) within the areas targeted by this assay, and inadequate number of viral copies(<138 copies/mL). A negative result must be combined with clinical observations, patient history, and epidemiological information. The expected result is Negative.  Fact Sheet for Patients:  EntrepreneurPulse.com.au  Fact Sheet for Healthcare Providers:  IncredibleEmployment.be  This test is no t yet approved or cleared by the [place] FDA and  has been authorized for detection  and/or diagnosis of SARS-CoV-2 by FDA under an Emergency Use Authorization (EUA). This EUA will remain  in effect (meaning this test can be used) for the duration of the COVID-19 declaration under Section 564(b)(1) of the Act, 21 U.S.C.section 360bbb-3(b)(1), unless the authorization is terminated  or revoked sooner.       Influenza A by PCR NEGATIVE NEGATIVE Final   Influenza B by PCR NEGATIVE NEGATIVE Final    Comment: (NOTE) The Xpert Xpress SARS-CoV-2/FLU/RSV plus assay is intended as an aid in the diagnosis of influenza from Nasopharyngeal swab specimens and should not be used as a sole basis for treatment. Nasal washings and aspirates are unacceptable for Xpert Xpress SARS-CoV-2/FLU/RSV testing.  Fact Sheet for Patients: EntrepreneurPulse.com.au  Fact Sheet for Healthcare Providers: IncredibleEmployment.be  This test is not yet approved or cleared by the [place] FDA and has been authorized for detection and/or diagnosis of SARS-CoV-2 by FDA under an Emergency Use Authorization (EUA). This EUA will remain in effect (meaning this test can be used) for the duration of the COVID-19 declaration under Section 564(b)(1) of the Act, 21 U.S.C. section 360bbb-3(b)(1), unless the authorization is terminated or revoked.  Performed at [facility], [place] [address] [place], [place] [address]   Resp Panel by RT-PCR (Flu A&B, Covid) Nasopharyngeal Swab     Status: None   Collection Time: 02/08/21  5:23 PM   Specimen: Nasopharyngeal Swab; Nasopharyngeal(NP) swabs in vial transport medium  Result Value Ref Range Status   SARS Coronavirus 2 by RT PCR NEGATIVE NEGATIVE Final    Comment: (NOTE) SARS-CoV-2 target nucleic acids are NOT DETECTED.  The SARS-CoV-2 RNA is generally detectable in upper respiratory specimens during the acute phase of infection. The lowest concentration of SARS-CoV-2 viral copies this assay can detect is 138  copies/mL. A negative result does not preclude SARS-Cov-2 infection and should not be used as the sole basis for treatment or other patient management decisions. A negative result may occur with  improper specimen collection/handling, submission of specimen other than nasopharyngeal swab, presence of viral mutation(s) within the areas targeted by this assay, and inadequate number of viral copies(<138 copies/mL). A negative result must be combined with clinical observations, patient history, and epidemiological information. The expected result is  Negative.  Fact Sheet for Patients:  EntrepreneurPulse.com.au  Fact Sheet for Healthcare Providers:  IncredibleEmployment.be  This test is no t yet approved or cleared by the [place] FDA and  has been authorized for detection and/or diagnosis of SARS-CoV-2 by FDA under an Emergency Use Authorization (EUA). This EUA will remain  in effect (meaning this test can be used) for the duration of the COVID-19 declaration under Section 564(b)(1) of the Act, 21 U.S.C.section 360bbb-3(b)(1), unless the authorization is terminated  or revoked sooner.       Influenza Inigo Lantigua by PCR NEGATIVE NEGATIVE Final   Influenza B by PCR NEGATIVE NEGATIVE Final    Comment: (NOTE) The Xpert Xpress SARS-CoV-2/FLU/RSV plus assay is intended as an aid in the diagnosis of influenza from Nasopharyngeal swab specimens and should not be used as [place] sole basis for treatment. Nasal washings and aspirates are unacceptable for Xpert Xpress SARS-CoV-2/FLU/RSV testing.  Fact Sheet for Patients: EntrepreneurPulse.com.au  Fact Sheet for Healthcare Providers: IncredibleEmployment.be  This test is not yet approved or cleared by the [place] FDA and has been authorized for detection and/or diagnosis of SARS-CoV-2 by FDA under an Emergency Use Authorization (EUA). This EUA will remain in effect (meaning  this test can be used) for the duration of the COVID-19 declaration under Section 564(b)(1) of the Act, 21 U.S.C. section 360bbb-3(b)(1), unless the authorization is terminated or revoked.  Performed at [facility], [place] [address] [place], [place] [address]   Blood Culture (routine x 2)     Status: None (Preliminary result)   Collection Time: 02/08/21  5:23 PM   Specimen: BLOOD  Result Value Ref Range Status   Specimen Description   Final    BLOOD LEFT ANTECUBITAL Performed at [facility] [address] [place], [place] [address]    Special Requests   Final    BOTTLES DRAWN AEROBIC AND ANAEROBIC Blood Culture results may not be optimal due to an inadequate volume of blood received in culture bottles Performed at [facility] [address] [place], [place] [address]    Culture   Final    NO GROWTH < 12 HOURS Performed at [facility] [address], [place], [place] [address]    Report Status PENDING  Incomplete  Respiratory (~20 pathogens) panel by PCR     Status: None   Collection Time: 02/08/21 10:43 PM   Specimen: Nasopharyngeal Swab; Respiratory  Result Value Ref Range Status   Adenovirus NOT DETECTED NOT DETECTED Final   Coronavirus 229E NOT DETECTED NOT DETECTED Final    Comment: (NOTE) The Coronavirus on the Respiratory Panel, DOES NOT test for the novel  Coronavirus (2019 nCoV)    Coronavirus HKU1 NOT DETECTED NOT DETECTED Final   Coronavirus NL63 NOT DETECTED NOT DETECTED Final   Coronavirus OC43 NOT DETECTED NOT DETECTED Final   Metapneumovirus NOT DETECTED NOT DETECTED Final   Rhinovirus / Enterovirus NOT DETECTED NOT DETECTED Final   Influenza Quin Mcpherson NOT DETECTED NOT DETECTED Final   Influenza B NOT DETECTED NOT DETECTED Final   Parainfluenza Virus 1 NOT DETECTED NOT DETECTED Final   Parainfluenza Virus 2 NOT DETECTED NOT DETECTED Final   Parainfluenza Virus 3 NOT DETECTED NOT DETECTED Final    Parainfluenza Virus 4 NOT DETECTED NOT DETECTED Final   Respiratory Syncytial Virus NOT DETECTED NOT DETECTED Final   Bordetella pertussis NOT DETECTED NOT DETECTED Final   Bordetella Parapertussis NOT DETECTED NOT DETECTED Final  Chlamydophila pneumoniae NOT DETECTED NOT DETECTED Final   Mycoplasma pneumoniae NOT DETECTED NOT DETECTED Final    Comment: Performed at [facility], [place] [address] [place], [place] [address]  Gastrointestinal Panel by PCR , Stool     Status: None   Collection Time: 02/09/21  7:35 AM   Specimen: Stool  Result Value Ref Range Status   Campylobacter species NOT DETECTED NOT DETECTED Final   Plesimonas shigelloides NOT DETECTED NOT DETECTED Final   Salmonella species NOT DETECTED NOT DETECTED Final   Yersinia enterocolitica NOT DETECTED NOT DETECTED Final   Vibrio species NOT DETECTED NOT DETECTED Final   Vibrio cholerae NOT DETECTED NOT DETECTED Final   Enteroaggregative E coli (EAEC) NOT DETECTED NOT DETECTED Final   Enteropathogenic E coli (EPEC) NOT DETECTED NOT DETECTED Final   Enterotoxigenic E coli (ETEC) NOT DETECTED NOT DETECTED Final   Shiga like toxin producing E coli (STEC) NOT DETECTED NOT DETECTED Final   Shigella/Enteroinvasive E coli (EIEC) NOT DETECTED NOT DETECTED Final   Cryptosporidium NOT DETECTED NOT DETECTED Final   Cyclospora cayetanensis NOT DETECTED NOT DETECTED Final   Entamoeba histolytica NOT DETECTED NOT DETECTED Final   Giardia lamblia NOT DETECTED NOT DETECTED Final   Adenovirus F40/41 NOT DETECTED NOT DETECTED Final   Astrovirus NOT DETECTED NOT DETECTED Final   Norovirus GI/GII NOT DETECTED NOT DETECTED Final   Rotavirus Letcher Schweikert NOT DETECTED NOT DETECTED Final   Sapovirus (I, II, IV, and V) NOT DETECTED NOT DETECTED Final    Comment: Performed at [facility], [place] [place], [place] [address]  C Difficile Quick Screen w PCR reflex     Status: None   Collection Time: 02/09/21  7:35 AM   Specimen: Stool   Result Value Ref Range Status   C Diff antigen NEGATIVE NEGATIVE Final   C Diff toxin NEGATIVE NEGATIVE Final   C Diff interpretation No C. difficile detected.  Final    Comment: Performed at [facility], [place] [address] [place], [place] [address]         Radiology Studies: CT ABDOMEN PELVIS WO CONTRAST  Result Date: 02/08/2021 CLINICAL DATA:  Bladder neck obstruction, rule out obstructing stone. EXAM: CT ABDOMEN AND PELVIS WITHOUT CONTRAST TECHNIQUE: Multidetector CT imaging of the abdomen and pelvis was performed following the standard protocol without IV contrast. RADIATION DOSE REDUCTION: This exam was performed according to the departmental dose-optimization program which includes automated exposure control, adjustment of the mA and/or kV according to patient size and/or use of iterative reconstruction technique. COMPARISON:  None. FINDINGS: Lower chest: Streaky and linear ground-glass opacities in the lung bases, typical of scarring and hypoventilatory change. Trace bilateral pleural thickening without significant effusion. Hepatobiliary: Decreased hepatic density typical of steatosis no focal hepatic lesion on this unenhanced exam. Gallbladder is poorly defined, tentatively visualized. No calcified gallstone. No biliary dilatation. Pancreas: No ductal dilatation or inflammation. Spleen: Normal in size without focal abnormality. Splenule anteriorly. Adrenals/Urinary Tract: No adrenal nodule. No hydronephrosis or renal calculi. No significant perinephric edema. There is indistinctness of the right renal sinus fat. Both ureters are decompressed without stones along the course. Urinary bladder is physiologically distended. There is no bladder stone. No bladder wall thickening or evidence of focal mass on this unenhanced exam. No urethral stone is visualized. Stomach/Bowel: Stomach is within normal limits. Appendix appears normal. No evidence of bowel wall thickening,  distention, or inflammatory changes. Vascular/Lymphatic: Normal caliber abdominal aorta. No portal venous or mesenteric  gas. Scattered small retroperitoneal and central mesenteric nodes, not enlarged by size criteria, typically reactive. Reproductive: Anteverted uterus. 2.7 cm follicular cyst in the left ovary is physiologic. No dedicated further evaluation is needed. Normal CT appearance of the right ovary. No adnexal mass. Other: No free air, free fluid, or intra-abdominal fluid collection. Small fat containing umbilical hernia. Musculoskeletal: There are no acute or suspicious osseous abnormalities. IMPRESSION: 1. No urolithiasis or obstructive uropathy. 2. Indistinct right renal sinus fat, can be seen with urinary tract infection. Recommend correlation with urinalysis. 3. Hepatic steatosis. 4. Small fat containing umbilical hernia. Electronically Signed   By: Keith Rake M.D.   On: 02/08/2021 19:35   CT CHEST WO CONTRAST  Result Date: 02/09/2021 CLINICAL DATA:  Fever with chest pain and shortness of breath. EXAM: CT CHEST WITHOUT CONTRAST TECHNIQUE: Multidetector CT imaging of the chest was performed following the standard protocol without IV contrast. RADIATION DOSE REDUCTION: This exam was performed according to the departmental dose-optimization program which includes automated exposure control, adjustment of the mA and/or kV according to patient size and/or use of iterative reconstruction technique. COMPARISON:  Chest radiographs 02/08/2021 and 02/07/2021. Abdominopelvic CT 02/08/2021. FINDINGS: Cardiovascular: No significant vascular findings on noncontrast imaging. The heart size is normal. There is no pericardial effusion. Mediastinum/Nodes: There are no enlarged mediastinal, hilar or axillary lymph nodes.There are multiple small mediastinal and hilar lymph nodes bilaterally which do not appear pathologically enlarged. Hilar assessment is limited by the lack of intravenous contrast. The thyroid  gland, trachea and esophagus demonstrate no significant findings. Lungs/Pleura: No pleural effusion or pneumothorax. Streaky and bandlike opacities dependently within both lower lobes have progressed compared with yesterday's abdominal CT. There is associated diffuse central airway thickening. No consolidation or suspicious pulmonary nodule. Upper abdomen: Diffuse low-density again noted throughout the liver consistent with steatosis. No acute findings are seen within the visualized upper abdomen. Musculoskeletal/Chest wall: There is no chest wall mass or suspicious osseous finding. IMPRESSION: 1. Increased streaky and bandlike opacities in both lower lobes compared with abdominal CT performed yesterday. Although possibly due to atelectasis, in the setting of fever, findings could reflect bronchopneumonia or aspiration. Recommend chest radiographic follow-up. 2. No pleural effusion. 3. Mildly prominent axillary and mediastinal lymph nodes bilaterally, likely reactive. 4. Hepatic steatosis. Electronically Signed   By: Richardean Sale M.D.   On: 02/09/2021 11:09   US RENAL  Result Date: 02/08/2021 CLINICAL DATA:  Urinary tract obstruction. EXAM: RENAL / URINARY TRACT ULTRASOUND COMPLETE COMPARISON:  CT abdomen pelvis dated 02/08/2021. FINDINGS: Right Kidney: Renal measurements: 13.3 x 5.7 x 7.2 cm = volume: 284 mL. Increased echogenicity. No hydronephrosis or shadowing stone. Left Kidney: Renal measurements: 14.0 x 7.3 x 7.4 cm = volume: 392 mL. Increased echogenicity. No hydronephrosis or shadowing stone. Bladder: Appears normal for degree of bladder distention. Other: None. IMPRESSION: Mildly echogenic kidneys may represent medical renal disease. No hydronephrosis or shadowing stone. Electronically Signed   By: Anner Crete M.D.   On: 02/08/2021 19:58   DG Chest Port 1 View  Result Date: 02/08/2021 CLINICAL DATA:  Questionable sepsis EXAM: PORTABLE CHEST 1 VIEW COMPARISON:  February 07, 2021 FINDINGS: The  heart size and mediastinal contours are within normal limits. Both lungs are clear. The visualized skeletal structures are unremarkable. IMPRESSION: No active disease. Electronically Signed   By: Dahlia Bailiff M.D.   On: 02/08/2021 15:31        Scheduled Meds:  azithromycin  500 mg Oral Daily   Chlorhexidine Gluconate  Cloth  6 each Topical Daily   clotrimazole  10 mg Oral 5 X Daily   Continuous Infusions:  cefTRIAXone (ROCEPHIN)  IV     lactated ringers 150 mL/hr at 02/09/21 1646   metronidazole Stopped (02/09/21 1229)     LOS: 1 day    Time spent: over 30 min    Fayrene Helper, MD [facility]   To contact the attending provider between 7A-7P or the covering provider during after hours 7P-7A, please log into the web site [email or web address] and access using universal [facility] password for that web site. If you do not have the password, please call the hospital operator.  02/09/2021, 7:47 PM

## 2021-02-09 NOTE — ED Notes (Signed)
Pt BP noted to be 90s/40s taken 3 times in different locations. Paged Florene Glen MD. Received orders for LR bolus and reassess post-bolus and once lactic acid results obtained. Bolus hung. Will continue to monitor.

## 2021-02-09 NOTE — Plan of Care (Signed)
Report received from [place], [place]. VS taken upon pt arrival to unit. Pt oriented to room and call bell. PIV dressing changed.

## 2021-02-09 NOTE — Assessment & Plan Note (Addendum)
Creatinine peaked at 5 improved Continue to follow I/O UA with 30 mg/dl protein, 6-10 RBC's, 21-50 wbc's repeat outpatient given protein and microscopic hematuria

## 2021-02-09 NOTE — Assessment & Plan Note (Addendum)
Replace and follow outpatient (worsened today with lasix, replete)

## 2021-02-09 NOTE — Assessment & Plan Note (Addendum)
Replace and follow. ?

## 2021-02-09 NOTE — Assessment & Plan Note (Signed)
Persistent despite IV fluid rehydration, abd non tender, CTA abd neg Pt clinically improving renal function improving Will continue to rehydrate and recheck

## 2021-02-09 NOTE — Assessment & Plan Note (Addendum)
Potential source of infection on right thigh Mild erythema/warmth, but no pain Follow outpatient

## 2021-02-09 NOTE — Progress Notes (Signed)
Flutter valve provided to Pt.  Pt demonstrated good technique and understanding of flutter valve.

## 2021-02-09 NOTE — Assessment & Plan Note (Signed)
improving

## 2021-02-09 NOTE — ED Notes (Signed)
Pt pressure up to 119/67. Paged Lowell Guitar MD with results.

## 2021-02-09 NOTE — Assessment & Plan Note (Addendum)
D/c foley, follow

## 2021-02-09 NOTE — ED Notes (Signed)
Patients mother would like a call with an update.

## 2021-02-09 NOTE — Plan of Care (Signed)
CARE PLAN REVIEWED WITH THE PATIENT AND MOTHER. Noted plan of care is clear and pt and family express an understanding of treatment plan

## 2021-02-09 NOTE — Progress Notes (Signed)
PHARMACY NOTE:  ANTIMICROBIAL RENAL DOSAGE ADJUSTMENT  Current antimicrobial regimen includes a mismatch between antimicrobial dosage and estimated renal function.  As per policy approved by the Pharmacy & Therapeutics and Medical Executive Committees, the antimicrobial dosage will be adjusted accordingly.  Current antimicrobial dosage:  Cefepime 2 g iv q 24 hours  Indication: sepsis  Renal Function:  Estimated Creatinine Clearance: 53.3 mL/min (A) (by C-G formula based on SCr of 2.08 mg/dL (H)). []      On intermittent HD, scheduled: []      On CRRT    Antimicrobial dosage has been changed to:  cefepime 2 g iv q 12 hours  Additional comments:   Thank you for allowing pharmacy to be a part of this patient's care.  , [facility] 02/09/2021 10:27 AM

## 2021-02-09 NOTE — Assessment & Plan Note (Addendum)
improved

## 2021-02-09 NOTE — Assessment & Plan Note (Addendum)
Hemodynamically mediated? Shock liver? Fatty liver? Bili mildly elevated (no biliary dilatation on CT) Negative acute hepatitis panel. Follow outpatient

## 2021-02-09 NOTE — ED Notes (Signed)
Pt to and from bedside commode. Maintained O2 95%-97%

## 2021-02-09 NOTE — Assessment & Plan Note (Addendum)
Fever, tachycardia, tachypnea At this time, with CT scan concerning for bronchopneumonia or aspiration -> community acquired pneumonia seems to be most likely cause for her presentation with fever and shortness of breath CXR with findings concerning for pneumonia vs edema CXR 1/27 with improving interstitial opacities RVP negative Negative covid, influenza MRSA PCR pending Negative HIV.  Negative UDS.  TSH wnl. Elevated ESR, CRP ANA negative UA not c/w UTI, follow culture NG Blood cultures NG Negative strep.  Pending legionella.  Pending sputum cx (not collected). Discharge on vantin and azithro given penicillin allergy.  Can d/c flagyl. Procalcitonin is significantly elevated, 19.2, supports concern for bacterial infection.  Improving  WBC is normal, which is surprising given her acute illness.

## 2021-02-09 NOTE — ED Notes (Signed)
Pt's mother seen assisting with keeping room clean, moving linens to cart. Mother works in [employer]. Biochemist, clinical has offered multiple times to clean room.

## 2021-02-09 NOTE — Hospital Course (Addendum)
21 yo who presented with fevers, chills, diarrhea initially treated for presumed infected tattoo.  Now she's returned with AKI, shortness of breath, and imaging findings concerning for pneumonia.  She's improved with IV abx and IV fluids.  See below for additional details

## 2021-02-09 NOTE — Assessment & Plan Note (Addendum)
Due to pneumonia CT as above CXR with pulm edema and possible pneumonia Echo with EF 60-65%, no RWMA She's also improved with nebs, follow closely Given low dose lasix x2 doses

## 2021-02-10 ENCOUNTER — Inpatient Hospital Stay (HOSPITAL_COMMUNITY): Payer: Medicaid Other

## 2021-02-10 DIAGNOSIS — R000 Tachycardia, unspecified: Secondary | ICD-10-CM

## 2021-02-10 DIAGNOSIS — R0602 Shortness of breath: Secondary | ICD-10-CM

## 2021-02-10 LAB — CBC WITH DIFFERENTIAL/PLATELET
Abs Immature Granulocytes: 0.18 10*3/uL — ABNORMAL HIGH (ref 0.00–0.07)
Basophils Absolute: 0.1 10*3/uL (ref 0.0–0.1)
Basophils Relative: 1 %
Eosinophils Absolute: 0.3 10*3/uL (ref 0.0–0.5)
Eosinophils Relative: 4 %
HCT: 34.8 % — ABNORMAL LOW (ref 36.0–46.0)
Hemoglobin: 11.5 g/dL — ABNORMAL LOW (ref 12.0–15.0)
Immature Granulocytes: 2 %
Lymphocytes Relative: 27 %
Lymphs Abs: 2.3 10*3/uL (ref 0.7–4.0)
MCH: 29.7 pg (ref 26.0–34.0)
MCHC: 33 g/dL (ref 30.0–36.0)
MCV: 89.9 fL (ref 80.0–100.0)
Monocytes Absolute: 0.4 10*3/uL (ref 0.1–1.0)
Monocytes Relative: 5 %
Neutro Abs: 5.3 10*3/uL (ref 1.7–7.7)
Neutrophils Relative %: 61 %
Platelets: 148 10*3/uL — ABNORMAL LOW (ref 150–400)
RBC: 3.87 MIL/uL (ref 3.87–5.11)
RDW: 13.7 % (ref 11.5–15.5)
WBC: 8.6 10*3/uL (ref 4.0–10.5)
nRBC: 0 % (ref 0.0–0.2)

## 2021-02-10 LAB — COMPREHENSIVE METABOLIC PANEL
ALT: 36 U/L (ref 0–44)
AST: 31 U/L (ref 15–41)
Albumin: 2.5 g/dL — ABNORMAL LOW (ref 3.5–5.0)
Alkaline Phosphatase: 68 U/L (ref 38–126)
Anion gap: 7 (ref 5–15)
BUN: 12 mg/dL (ref 6–20)
CO2: 23 mmol/L (ref 22–32)
Calcium: 8.3 mg/dL — ABNORMAL LOW (ref 8.9–10.3)
Chloride: 107 mmol/L (ref 98–111)
Creatinine, Ser: 0.63 mg/dL (ref 0.44–1.00)
GFR, Estimated: >60 mL/min (ref >60)
Glucose, Bld: 87 mg/dL (ref 70–99)
Potassium: 3.5 mmol/L (ref 3.5–5.1)
Sodium: 137 mmol/L (ref 135–145)
Total Bilirubin: 2.1 mg/dL — ABNORMAL HIGH (ref 0.3–1.2)
Total Protein: 5.9 g/dL — ABNORMAL LOW (ref 6.5–8.1)

## 2021-02-10 LAB — ECHOCARDIOGRAM COMPLETE
AR max vel: 2.19 cm2
AV Area VTI: 2.23 cm2
AV Area mean vel: 2.22 cm2
AV Mean grad: 6 mmHg
AV Peak grad: 13.2 mmHg
Ao pk vel: 1.82 m/s
Area-P 1/2: 6.71 cm2
Height: 66 in
S' Lateral: 3 cm
Weight: 4291.2 oz

## 2021-02-10 LAB — MAGNESIUM: Magnesium: 1.9 mg/dL (ref 1.7–2.4)

## 2021-02-10 LAB — PROCALCITONIN: Procalcitonin: 2.53 ng/mL

## 2021-02-10 LAB — PHOSPHORUS: Phosphorus: 2.2 mg/dL — ABNORMAL LOW (ref 2.5–4.6)

## 2021-02-10 LAB — URINE CULTURE: Culture: NO GROWTH

## 2021-02-10 LAB — ANA W/REFLEX IF POSITIVE: Anti Nuclear Antibody (ANA): NEGATIVE

## 2021-02-10 LAB — BRAIN NATRIURETIC PEPTIDE: B Natriuretic Peptide: 178.9 pg/mL — ABNORMAL HIGH (ref 0.0–100.0)

## 2021-02-10 MED ORDER — LEVALBUTEROL HCL 0.63 MG/3ML IN NEBU
0.6300 mg | INHALATION_SOLUTION | Freq: Four times a day (QID) | RESPIRATORY_TRACT | Status: DC
  Filled 2021-02-10: qty 3

## 2021-02-10 MED ORDER — FUROSEMIDE 20 MG PO TABS
20.0000 mg | ORAL_TABLET | Freq: Every day | ORAL | Status: DC
  Administered 2021-02-10 – 2021-02-11 (×2): 20 mg via ORAL
  Filled 2021-02-10 (×2): qty 1

## 2021-02-10 MED ORDER — LEVALBUTEROL HCL 0.63 MG/3ML IN NEBU
0.6300 mg | INHALATION_SOLUTION | Freq: Three times a day (TID) | RESPIRATORY_TRACT | Status: DC
  Administered 2021-02-10 – 2021-02-11 (×3): 0.63 mg via RESPIRATORY_TRACT
  Filled 2021-02-10 (×2): qty 3

## 2021-02-10 MED ORDER — SODIUM CHLORIDE 0.9 % IV SOLN: INTRAVENOUS | Status: DC | PRN

## 2021-02-10 NOTE — TOC Progression Note (Signed)
Transition of Care [facility]) - Progression Note    Patient Details  Name: Beverly Clark MRN: [id number] Date of Birth: 22-Jul-2000  [facility]) CM/SW Contact  Geni Bers, RN Phone Number: 02/10/2021, 11:18 AM  Clinical Narrative:    Pt from home with sister and plan to return home. At present time there are no needs.    Expected Discharge Plan: Home/Self Care Barriers to Discharge: No Barriers Identified  Expected Discharge Plan and Services Expected Discharge Plan: Home/Self Care       Living arrangements for the past 2 months: Single Family Home                                       Social Determinants of Health (SDOH) Interventions    Readmission Risk Interventions No flowsheet data found.

## 2021-02-10 NOTE — Assessment & Plan Note (Addendum)
Improved, now in the 90's at rest Continue to follow outpatient, expect this to improve with continued improvement of pneumonia

## 2021-02-10 NOTE — Progress Notes (Signed)
PROGRESS NOTE    Beverly Clark  ION:[id number] DOB: [birth year] DOA: 02/08/2021 PCP: Pcp, No  Chief Complaint  Patient presents with   Dizziness   Fever    Brief Narrative:  21 yo who presented with fevers, chills, diarrhea initially treated for presumed infected tattoo.  Now she's returned with AKI, shortness of breath, and imaging findings concerning for pneumonia.  See below for additional details    Assessment & Plan:   Principal Problem:   Sepsis due to pneumonia [facility]) Active Problems:   AKI (acute kidney injury) ([place])   Sinus tachycardia   Shortness of breath   Tattoo of skin   Hyponatremia   Hypokalemia   Dehydration   Hypomagnesemia   Diarrhea   Elevated liver enzymes   Lactic acidosis   Lymphadenopathy   Foley catheter in place   * Sepsis due to pneumonia ([place]) Fever, tachycardia, tachypnea At this time, with CT scan concerning for bronchopneumonia or aspiration -> community acquired pneumonia seems to be most likely cause for her presentation with fever and shortness of breath CXR with findings concerning for pneumonia vs edema RVP negative Negative covid, influenza MRSA PCR pending Negative HIV.  Negative UDS.  TSH wnl. Elevated ESR, CRP ANA negative UA not c/w UTI, follow culture Blood cultures pending Negative strep.  Pending legionella.  Pending sputum cx. Will narrow to ceftriaxone and flagyl.  Add azithromycin. Procalcitonin is significantly elevated, 19.2, supports concern for bacterial infection.  Improving  WBC is normal, which is surprising given her acute illness.     Sinus tachycardia 2/2 pneumonia above, follow   AKI (acute kidney injury) ([place])- (present on admission) Creatinine peaked at 5 improved Continue to follow I/O UA with 30 mg/dl protein, 6-10 RBC's, 21-50 wbc's  Tattoo of skin Potential source of infection on right thigh Mild erythema/warmth, but no pain -> suspect this is less likely source, consider broadening  abx  Shortness of breath Due to pneumonia CT as above CXR with pulm edema and possible pneumonia Echo with EF 60-65%, no RWMA Will start low dose lasix with imaging concerning for edema She's also improved with nebs, follow closely  Elevated liver enzymes Hemodynamically mediated? Shock liver? Bili mildly elevated (no biliary dilatation on CT) If not improving, follow RUQ [place]  Diarrhea- (present on admission) Negative GI path panel and C diff  Hypomagnesemia- (present on admission) improved  Hypokalemia- (present on admission) improved  Hyponatremia- (present on admission) improving  Lactic acidosis- (present on admission) Will trend, improving with IVF  Lymphadenopathy Reactive? Follow outpatient  Foley catheter in place D/c foley, follow   DVT prophylaxis: lovenox Code Status: full Family Communication: mother Disposition:   Status is: Inpatient  Remains inpatient appropriate because: need for IV abx       Consultants:  none  Procedures:  none  Antimicrobials:  Anti-infectives (From admission, onward)    Start     Dose/Rate Route Frequency Ordered Stop   02/09/21 2200  cefTRIAXone (ROCEPHIN) 2 g in sodium chloride 0.9 % 100 mL IVPB        2 g 200 mL/hr over 30 Minutes Intravenous Every 24 hours 02/09/21 1945 02/14/21 2159   02/09/21 2000  ceFEPIme (MAXIPIME) 2 g in sodium chloride 0.9 % 100 mL IVPB  Status:  Discontinued        2 g 200 mL/hr over 30 Minutes Intravenous Every 24 hours 02/08/21 2217 02/09/21 1027   02/09/21 1130  azithromycin (ZITHROMAX) tablet 500 mg  500 mg Oral Daily 02/09/21 1117     02/09/21 1030  ceFEPIme (MAXIPIME) 2 g in sodium chloride 0.9 % 100 mL IVPB  Status:  Discontinued        2 g 200 mL/hr over 30 Minutes Intravenous Every 12 hours 02/09/21 1027 02/09/21 1945   02/09/21 0800  metroNIDAZOLE (FLAGYL) IVPB 500 mg        500 mg 100 mL/hr over 60 Minutes Intravenous Every 12 hours 02/08/21 2203     02/08/21  1930  ceFEPIme (MAXIPIME) 2 g in sodium chloride 0.9 % 100 mL IVPB        2 g 200 mL/hr over 30 Minutes Intravenous  Once 02/08/21 1917 02/08/21 2006   02/08/21 1930  metroNIDAZOLE (FLAGYL) IVPB 500 mg        500 mg 100 mL/hr over 60 Minutes Intravenous  Once 02/08/21 1917 02/08/21 2120       Subjective: Feels better Still SOB  Objective: Vitals:   02/10/21 0007 02/10/21 0354 02/10/21 1244 02/10/21 1427  BP: (!) 98/43 (!) 148/84 (!) 137/91   Pulse: (!) 113 (!) 108 (!) 110   Resp:   20   Temp: 98.6 F (37 C) 97.7 F (36.5 C) 98.1 F (36.7 C)   TempSrc: Oral Oral Oral   SpO2: 91% 93% 94% 96%  Weight:      Height:        Intake/Output Summary (Last 24 hours) at 02/10/2021 1852 Last data filed at 02/10/2021 1756 Gross per 24 hour  Intake 1883.19 ml  Output 4370 ml  Net -2486.81 ml   Filed Weights   02/08/21 1502 02/09/21 1603  Weight: 110 kg 121.7 kg    Examination:  General: No acute distress.  Looks better today, more comfortable. Cardiovascular: tachycardic, regular Lungs: increased WOB after sitting up Abdomen: Soft, nontender, nondistended  Neurological: Alert and oriented 3. Moves all extremities 4. Cranial nerves II through XII grossly intact. Skin: Warm and dry. No rashes or lesions. Extremities: No clubbing or cyanosis. No edema.   Data Reviewed: I have personally reviewed following labs and imaging studies  CBC: Recent Labs  Lab 02/08/21 1723 02/08/21 2040 02/09/21 0536 02/10/21 0343  WBC 9.6 6.6 7.0 8.6  NEUTROABS 7.7 5.9 6.2 5.3  HGB 13.1 12.9 12.5 11.5*  HCT 39.4 37.6 37.1 34.8*  MCV 89.5 87.4 88.3 89.9  PLT 184 174 153 148*    Basic Metabolic Panel: Recent Labs  Lab 02/08/21 2040 02/09/21 0102 02/09/21 0536 02/09/21 1130 02/09/21 1642 02/10/21 0343  NA 128* 131* 133* 131* 134* 137  K 3.3* 3.3* 3.6 3.4* 3.7 3.5  CL 97* 100 103 103 105 107  CO2 18* 20* 19* 20* 20* 23  GLUCOSE 81 85 108* 102* 87 87  BUN 57* 53* 41* 28* 22* 12   CREATININE 4.97* 3.10* 2.08* 1.24* 1.07* 0.63  CALCIUM 7.7* 7.7* 8.1* 8.1* 8.3* 8.3*  MG 1.3*  --  1.9  --   --  1.9  PHOS 3.5  --  1.8*  --   --  2.2*    GFR: Estimated Creatinine Clearance: 146.8 mL/min (by C-G formula based on SCr of 0.63 mg/dL).  Liver Function Tests: Recent Labs  Lab 02/08/21 1723 02/08/21 2040 02/09/21 0536 02/09/21 1642 02/10/21 0343  AST _0 ALT 39 38 36 35 36  ALKPHOS 48 48 50 59 68  BILITOT 2.6* 2.6* 2.4* 2.7* 2.1*  PROT 6.3* 6.2* 5.8* 5.8* 5.9*  ALBUMIN  2.9* 2.8* 2.7* 2.6* 2.5*    CBG: No results for input(s): GLUCAP in the last 168 hours.   Recent Results (from the past 240 hour(s))  Resp Panel by RT-PCR (Flu Zareth Rippetoe&B, Covid) Nasopharyngeal Swab     Status: None   Collection Time: 02/06/21  6:26 PM   Specimen: Nasopharyngeal Swab; Nasopharyngeal(NP) swabs in vial transport medium  Result Value Ref Range Status   SARS Coronavirus 2 by RT PCR NEGATIVE NEGATIVE Final    Comment: (NOTE) SARS-CoV-2 target nucleic acids are NOT DETECTED.  The SARS-CoV-2 RNA is generally detectable in upper respiratory specimens during the acute phase of infection. The lowest concentration of SARS-CoV-2 viral copies this assay can detect is 138 copies/mL. Kahleb Mcclane negative result does not preclude SARS-Cov-2 infection and should not be used as the sole basis for treatment or other patient management decisions. Jaxyn Mestas negative result may occur with  improper specimen collection/handling, submission of specimen other than nasopharyngeal swab, presence of viral mutation(s) within the areas targeted by this assay, and inadequate number of viral copies(<138 copies/mL). Malik Paar negative result must be combined with clinical observations, patient history, and epidemiological information. The expected result is Negative.  Fact Sheet for Patients:  EntrepreneurPulse.com.au  Fact Sheet for Healthcare Providers:   IncredibleEmployment.be  This test is no t yet approved or cleared by the [place] FDA and  has been authorized for detection and/or diagnosis of SARS-CoV-2 by FDA under an Emergency Use Authorization (EUA). This EUA will remain  in effect (meaning this test can be used) for the duration of the COVID-19 declaration under Section 564(b)(1) of the Act, 21 U.S.C.section 360bbb-3(b)(1), unless the authorization is terminated  or revoked sooner.       Influenza Shanya Ferriss by PCR NEGATIVE NEGATIVE Final   Influenza B by PCR NEGATIVE NEGATIVE Final    Comment: (NOTE) The Xpert Xpress SARS-CoV-2/FLU/RSV plus assay is intended as an aid in the diagnosis of influenza from Nasopharyngeal swab specimens and should not be used as Mirenda Baltazar sole basis for treatment. Nasal washings and aspirates are unacceptable for Xpert Xpress SARS-CoV-2/FLU/RSV testing.  Fact Sheet for Patients: EntrepreneurPulse.com.au  Fact Sheet for Healthcare Providers: IncredibleEmployment.be  This test is not yet approved or cleared by the [place] FDA and has been authorized for detection and/or diagnosis of SARS-CoV-2 by FDA under an Emergency Use Authorization (EUA). This EUA will remain in effect (meaning this test can be used) for the duration of the COVID-19 declaration under Section 564(b)(1) of the Act, 21 U.S.C. section 360bbb-3(b)(1), unless the authorization is terminated or revoked.  Performed at [facility], [place] [address], [place], [place] [address]   Resp Panel by RT-PCR (Flu Sherle Mello&B, Covid) Nasopharyngeal Swab     Status: None   Collection Time: 02/08/21  5:23 PM   Specimen: Nasopharyngeal Swab; Nasopharyngeal(NP) swabs in vial transport medium  Result Value Ref Range Status   SARS Coronavirus 2 by RT PCR NEGATIVE NEGATIVE Final    Comment: (NOTE) SARS-CoV-2 target nucleic acids are NOT DETECTED.  The SARS-CoV-2 RNA is generally detectable in  upper respiratory specimens during the acute phase of infection. The lowest concentration of SARS-CoV-2 viral copies this assay can detect is 138 copies/mL. Brix Brearley negative result does not preclude SARS-Cov-2 infection and should not be used as the sole basis for treatment or other patient management decisions. Sirius Woodford negative result may occur with  improper specimen collection/handling, submission of specimen other than nasopharyngeal swab, presence of viral mutation(s) within the areas targeted by  this assay, and inadequate number of viral copies(<138 copies/mL). Mancil Pfenning negative result must be combined with clinical observations, patient history, and epidemiological information. The expected result is Negative.  Fact Sheet for Patients:  EntrepreneurPulse.com.au  Fact Sheet for Healthcare Providers:  IncredibleEmployment.be  This test is no t yet approved or cleared by the [place] FDA and  has been authorized for detection and/or diagnosis of SARS-CoV-2 by FDA under an Emergency Use Authorization (EUA). This EUA will remain  in effect (meaning this test can be used) for the duration of the COVID-19 declaration under Section 564(b)(1) of the Act, 21 U.S.C.section 360bbb-3(b)(1), unless the authorization is terminated  or revoked sooner.       Influenza Catlynn Grondahl by PCR NEGATIVE NEGATIVE Final   Influenza B by PCR NEGATIVE NEGATIVE Final    Comment: (NOTE) The Xpert Xpress SARS-CoV-2/FLU/RSV plus assay is intended as an aid in the diagnosis of influenza from Nasopharyngeal swab specimens and should not be used as Arraya Buck sole basis for treatment. Nasal washings and aspirates are unacceptable for Xpert Xpress SARS-CoV-2/FLU/RSV testing.  Fact Sheet for Patients: EntrepreneurPulse.com.au  Fact Sheet for Healthcare Providers: IncredibleEmployment.be  This test is not yet approved or cleared by the [place] FDA and has been  authorized for detection and/or diagnosis of SARS-CoV-2 by FDA under an Emergency Use Authorization (EUA). This EUA will remain in effect (meaning this test can be used) for the duration of the COVID-19 declaration under Section 564(b)(1) of the Act, 21 U.S.C. section 360bbb-3(b)(1), unless the authorization is terminated or revoked.  Performed at [facility], [place] [address] [place], [place] [address]   Blood Culture (routine x 2)     Status: None (Preliminary result)   Collection Time: 02/08/21  5:23 PM   Specimen: BLOOD  Result Value Ref Range Status   Specimen Description   Final    BLOOD LEFT ANTECUBITAL Performed at [place] [address], [place], [place] [address]    Special Requests   Final    BOTTLES DRAWN AEROBIC AND ANAEROBIC Blood Culture results may not be optimal due to an inadequate volume of blood received in culture bottles Performed at [place] [address], [place], [place] [address]    Culture   Final    NO GROWTH 2 DAYS Performed at [place] [address], [place], [place] [address]    Report Status PENDING  Incomplete  Blood Culture (routine x 2)     Status: None (Preliminary result)   Collection Time: 02/08/21  7:33 PM   Specimen: BLOOD  Result Value Ref Range Status   Specimen Description   Final    BLOOD BLOOD LEFT HAND Performed at [place] [address] [place], [place] [address]    Special Requests   Final    BOTTLES DRAWN AEROBIC ONLY Blood Culture adequate volume Performed at [place] [address], [place], [place] [address]    Culture   Final    NO GROWTH 1 DAY Performed at [facility], [place] [address], [place],  [address]    Report Status PENDING  Incomplete  Urine Culture     Status: None   Collection Time: 02/08/21  8:40 PM   Specimen: In/Out Cath Urine  Result Value Ref Range Status    Specimen Description   Final    IN/OUT CATH URINE Performed at [place] [address]  [place], [place] [address]    Special Requests   Final    NONE Performed at [facility], [place] [address] [place], [place] [address]    Culture   Final    NO GROWTH Performed at [facility], [place] [address], [place], [place] [address]    Report Status 02/10/2021 FINAL  Final  Respiratory (~20 pathogens) panel by PCR     Status: None   Collection Time: 02/08/21 10:43 PM   Specimen: Nasopharyngeal Swab; Respiratory  Result Value Ref Range Status   Adenovirus NOT DETECTED NOT DETECTED Final   Coronavirus 229E NOT DETECTED NOT DETECTED Final    Comment: (NOTE) The Coronavirus on the Respiratory Panel, DOES NOT test for the novel  Coronavirus (2019 nCoV)    Coronavirus HKU1 NOT DETECTED NOT DETECTED Final   Coronavirus NL63 NOT DETECTED NOT DETECTED Final   Coronavirus OC43 NOT DETECTED NOT DETECTED Final   Metapneumovirus NOT DETECTED NOT DETECTED Final   Rhinovirus / Enterovirus NOT DETECTED NOT DETECTED Final   Influenza Zyad Boomer NOT DETECTED NOT DETECTED Final   Influenza B NOT DETECTED NOT DETECTED Final   Parainfluenza Virus 1 NOT DETECTED NOT DETECTED Final   Parainfluenza Virus 2 NOT DETECTED NOT DETECTED Final   Parainfluenza Virus 3 NOT DETECTED NOT DETECTED Final   Parainfluenza Virus 4 NOT DETECTED NOT DETECTED Final   Respiratory Syncytial Virus NOT DETECTED NOT DETECTED Final   Bordetella pertussis NOT DETECTED NOT DETECTED Final   Bordetella Parapertussis NOT DETECTED NOT DETECTED Final   Chlamydophila pneumoniae NOT DETECTED NOT DETECTED Final   Mycoplasma pneumoniae NOT DETECTED NOT DETECTED Final    Comment: Performed at [facility], [place]. [address], [place],  [address]  Gastrointestinal Panel by PCR , Stool     Status: None   Collection Time: 02/09/21  7:35 AM   Specimen: Stool  Result Value Ref Range Status    Campylobacter species NOT DETECTED NOT DETECTED Final   Plesimonas shigelloides NOT DETECTED NOT DETECTED Final   Salmonella species NOT DETECTED NOT DETECTED Final   Yersinia enterocolitica NOT DETECTED NOT DETECTED Final   Vibrio species NOT DETECTED NOT DETECTED Final   Vibrio cholerae NOT DETECTED NOT DETECTED Final   Enteroaggregative E coli (EAEC) NOT DETECTED NOT DETECTED Final   Enteropathogenic E coli (EPEC) NOT DETECTED NOT DETECTED Final   Enterotoxigenic E coli (ETEC) NOT DETECTED NOT DETECTED Final   Shiga like toxin producing E coli (STEC) NOT DETECTED NOT DETECTED Final   Shigella/Enteroinvasive E coli (EIEC) NOT DETECTED NOT DETECTED Final   Cryptosporidium NOT DETECTED NOT DETECTED Final   Cyclospora cayetanensis NOT DETECTED NOT DETECTED Final   Entamoeba histolytica NOT DETECTED NOT DETECTED Final   Giardia lamblia NOT DETECTED NOT DETECTED Final   Adenovirus F40/41 NOT DETECTED NOT DETECTED Final   Astrovirus NOT DETECTED NOT DETECTED Final   Norovirus GI/GII NOT DETECTED NOT DETECTED Final   Rotavirus Liller Yohn NOT DETECTED NOT DETECTED Final   Sapovirus (I, II, IV, and V) NOT DETECTED NOT DETECTED Final    Comment: Performed at [facility], [place] [place], [place] [address]  C Difficile Quick Screen w PCR reflex     Status: None   Collection Time: 02/09/21  7:35 AM   Specimen: Stool  Result Value Ref Range Status   C Diff antigen NEGATIVE NEGATIVE Final   C Diff toxin NEGATIVE NEGATIVE Final   C Diff interpretation No C. difficile detected.  Final    Comment: Performed at [facility], [place] [address], [place], [place] [address]         Radiology Studies: CT ABDOMEN PELVIS WO CONTRAST  Result Date: 02/08/2021 CLINICAL DATA:  Bladder neck obstruction, rule out obstructing stone. EXAM: CT ABDOMEN AND PELVIS WITHOUT CONTRAST TECHNIQUE: Multidetector CT imaging of the abdomen and pelvis was performed following the standard protocol  without IV contrast. RADIATION DOSE REDUCTION: This exam was performed according to the departmental dose-optimization program which includes automated exposure control, adjustment of the mA and/or kV according to patient size and/or use of iterative reconstruction technique. COMPARISON:  None. FINDINGS: Lower chest: Streaky and linear ground-glass opacities in the lung bases, typical of scarring and hypoventilatory change. Trace bilateral pleural thickening without significant effusion. Hepatobiliary: Decreased hepatic density typical of steatosis no focal hepatic lesion on this unenhanced exam. Gallbladder is poorly defined, tentatively visualized. No calcified gallstone. No biliary dilatation. Pancreas: No ductal dilatation or inflammation. Spleen: Normal in size without focal abnormality. Splenule anteriorly. Adrenals/Urinary Tract: No adrenal nodule. No hydronephrosis or renal calculi. No significant perinephric edema. There is indistinctness of the right renal sinus fat. Both ureters are decompressed without stones along the course. Urinary bladder is physiologically distended. There is no bladder stone. No bladder wall thickening or evidence of focal mass on this unenhanced exam. No urethral stone is visualized. Stomach/Bowel: Stomach is within normal limits. Appendix appears normal. No evidence of bowel wall thickening, distention, or inflammatory changes. Vascular/Lymphatic: Normal caliber abdominal aorta. No portal venous or mesenteric gas. Scattered small retroperitoneal and central mesenteric nodes, not enlarged by size criteria, typically reactive. Reproductive: Anteverted uterus. 2.7 cm follicular cyst in the left ovary is physiologic. No dedicated further evaluation is needed. Normal CT appearance of the right ovary. No adnexal mass. Other: No free air, free fluid, or intra-abdominal fluid collection. Small fat containing umbilical hernia. Musculoskeletal: There are no acute or suspicious osseous  abnormalities. IMPRESSION: 1. No urolithiasis or obstructive uropathy. 2. Indistinct right renal sinus fat, can be seen with urinary tract infection. Recommend correlation with urinalysis. 3. Hepatic steatosis. 4. Small fat containing umbilical hernia. Electronically Signed   By: Keith Rake M.D.   On: 02/08/2021 19:35   CT CHEST WO CONTRAST  Result Date: 02/09/2021 CLINICAL DATA:  Fever with chest pain and shortness of breath. EXAM: CT CHEST WITHOUT CONTRAST TECHNIQUE: Multidetector CT imaging of the chest was performed following the standard protocol without IV contrast. RADIATION DOSE REDUCTION: This exam was performed according to the departmental dose-optimization program which includes automated exposure control, adjustment of the mA and/or kV according to patient size and/or use of iterative reconstruction technique. COMPARISON:  Chest radiographs 02/08/2021 and 02/07/2021. Abdominopelvic CT 02/08/2021. FINDINGS: Cardiovascular: No significant vascular findings on noncontrast imaging. The heart size is normal. There is no pericardial effusion. Mediastinum/Nodes: There are no enlarged mediastinal, hilar or axillary lymph nodes.There are multiple small mediastinal and hilar lymph nodes bilaterally which do not appear pathologically enlarged. Hilar assessment is limited by the lack of intravenous contrast. The thyroid gland, trachea and esophagus demonstrate no significant findings. Lungs/Pleura: No pleural effusion or pneumothorax. Streaky and bandlike opacities dependently within both lower lobes have progressed compared with yesterday's abdominal CT. There is associated diffuse central airway thickening. No consolidation or suspicious pulmonary nodule. Upper abdomen: Diffuse low-density again noted throughout the liver consistent with steatosis. No acute findings are seen within the visualized upper abdomen. Musculoskeletal/Chest wall: There is no chest wall mass or suspicious  osseous finding.  IMPRESSION: 1. Increased streaky and bandlike opacities in both lower lobes compared with abdominal CT performed yesterday. Although possibly due to atelectasis, in the setting of fever, findings could reflect bronchopneumonia or aspiration. Recommend chest radiographic follow-up. 2. No pleural effusion. 3. Mildly prominent axillary and mediastinal lymph nodes bilaterally, likely reactive. 4. Hepatic steatosis. Electronically Signed   By: Richardean Sale M.D.   On: 02/09/2021 11:09   US RENAL  Result Date: 02/08/2021 CLINICAL DATA:  Urinary tract obstruction. EXAM: RENAL / URINARY TRACT ULTRASOUND COMPLETE COMPARISON:  CT abdomen pelvis dated 02/08/2021. FINDINGS: Right Kidney: Renal measurements: 13.3 x 5.7 x 7.2 cm = volume: 284 mL. Increased echogenicity. No hydronephrosis or shadowing stone. Left Kidney: Renal measurements: 14.0 x 7.3 x 7.4 cm = volume: 392 mL. Increased echogenicity. No hydronephrosis or shadowing stone. Bladder: Appears normal for degree of bladder distention. Other: None. IMPRESSION: Mildly echogenic kidneys may represent medical renal disease. No hydronephrosis or shadowing stone. Electronically Signed   By: Anner Crete M.D.   On: 02/08/2021 19:58   DG CHEST PORT 1 VIEW  Result Date: 02/10/2021 CLINICAL DATA:  Shortness of breath. EXAM: PORTABLE CHEST 1 VIEW COMPARISON:  February 08, 2021. FINDINGS: The heart size and mediastinal contours are within normal limits. Interval development of bilateral perihilar and basilar opacities are noted concerning for pulmonary edema or possibly pneumonia. The visualized skeletal structures are unremarkable. IMPRESSION: Interval development of bilateral perihilar and basilar opacities are noted concerning for pulmonary edema or possibly pneumonia. Electronically Signed   By: Marijo Conception M.D.   On: 02/10/2021 08:26   ECHOCARDIOGRAM COMPLETE  Result Date: 02/10/2021    ECHOCARDIOGRAM REPORT   Patient Name:   Beverly Clark Date of Exam:  02/10/2021 Medical Rec #:  [id number]        Height:       66.0 in Accession #:    [id number]       Weight:       268.2 lb Date of Birth:  02-Feb-2000         BSA:          2.265 m Patient Age:    21 years         BP:           148/84 mmHg Patient Gender: F                HR:           108 bpm. Exam Location:  Inpatient Procedure: 2D Echo Indications:    Shortness of breath  History:        Patient has no prior history of Echocardiogram examinations.  Sonographer:    Arlyss Gandy Referring Phys: [place]  1. Left ventricular ejection fraction, by estimation, is 60 to 65%. The left ventricle has normal function. The left ventricle has no regional wall motion abnormalities. Left ventricular diastolic parameters were normal.  2. Right ventricular systolic function is normal. The right ventricular size is normal. There is normal pulmonary artery systolic pressure. The estimated right ventricular systolic pressure is 20.2 mmHg.  3. The mitral valve is normal in structure. No evidence of mitral valve regurgitation. No evidence of mitral stenosis.  4. The aortic valve is normal in structure. Aortic valve regurgitation is not visualized. No aortic stenosis is present.  5. The inferior vena cava is normal in size with greater than 50% respiratory variability, suggesting right atrial pressure of 3 mmHg.  FINDINGS  Left Ventricle: Left ventricular ejection fraction, by estimation, is 60 to 65%. The left ventricle has normal function. The left ventricle has no regional wall motion abnormalities. The left ventricular internal cavity size was normal in size. There is  no left ventricular hypertrophy. Left ventricular diastolic parameters were normal. Normal left ventricular filling pressure. Right Ventricle: The right ventricular size is normal. No increase in right ventricular wall thickness. Right ventricular systolic function is normal. There is normal pulmonary artery systolic pressure. The  tricuspid regurgitant velocity is 2.23 m/s, and  with an assumed right atrial pressure of 3 mmHg, the estimated right ventricular systolic pressure is 20.2 mmHg. Left Atrium: Left atrial size was normal in size. Right Atrium: Right atrial size was normal in size. Pericardium: There is no evidence of pericardial effusion. Mitral Valve: The mitral valve is normal in structure. No evidence of mitral valve regurgitation. No evidence of mitral valve stenosis. Tricuspid Valve: The tricuspid valve is normal in structure. Tricuspid valve regurgitation is trivial. No evidence of tricuspid stenosis. Aortic Valve: The aortic valve is normal in structure. Aortic valve regurgitation is not visualized. No aortic stenosis is present. Aortic valve mean gradient measures 6.0 mmHg. Aortic valve peak gradient measures 13.2 mmHg. Aortic valve area, by VTI measures 2.23 cm. Pulmonic Valve: The pulmonic valve was normal in structure. Pulmonic valve regurgitation is trivial. No evidence of pulmonic stenosis. Aorta: The aortic root is normal in size and structure. Venous: The inferior vena cava is normal in size with greater than 50% respiratory variability, suggesting right atrial pressure of 3 mmHg. IAS/Shunts: No atrial level shunt detected by color flow Doppler.  LEFT VENTRICLE PLAX 2D LVIDd:         4.40 cm   Diastology LVIDs:         3.00 cm   LV e' medial:    16.00 cm/s LV PW:         0.90 cm   LV E/e' medial:  7.2 LV IVS:        0.90 cm   LV e' lateral:   16.90 cm/s LVOT diam:     2.00 cm   LV E/e' lateral: 6.9 LV SV:         69 LV SV Index:   30 LVOT Area:     3.14 cm  RIGHT VENTRICLE             IVC RV Basal diam:  3.20 cm     IVC diam: 1.70 cm RV Mid diam:    2.60 cm RV S prime:     12.90 cm/s TAPSE (M-mode): 2.9 cm LEFT ATRIUM             Index        RIGHT ATRIUM           Index LA diam:        3.30 cm 1.46 cm/m   RA Area:     16.60 cm LA Vol (A2C):   37.3 ml 16.47 ml/m  RA Volume:   42.40 ml  18.72 ml/m LA Vol (A4C):    37.2 ml 16.42 ml/m LA Biplane Vol: 39.7 ml 17.52 ml/m  AORTIC VALVE AV Area (Vmax):    2.19 cm AV Area (Vmean):   2.22 cm AV Area (VTI):     2.23 cm AV Vmax:           182.00 cm/s AV Vmean:          114.000 cm/s AV  VTI:            0.308 m AV Peak Grad:      13.2 mmHg AV Mean Grad:      6.0 mmHg LVOT Vmax:         127.00 cm/s LVOT Vmean:        80.700 cm/s LVOT VTI:          0.219 m LVOT/AV VTI ratio: 0.71  AORTA Ao Root diam: 2.40 cm Ao Asc diam:  2.20 cm MITRAL VALVE                TRICUSPID VALVE MV Area (PHT): 6.71 cm     TR Peak grad:   19.9 mmHg MV Decel Time: 113 msec     TR Vmax:        223.00 cm/s MV E velocity: 116.00 cm/s MV Valerian Jewel velocity: 97.70 cm/s   SHUNTS MV E/Izaih Kataoka ratio:  1.19         Systemic VTI:  0.22 m                             Systemic Diam: 2.00 cm Fransico Him MD Electronically signed by Fransico Him MD Signature Date/Time: 02/10/2021/12:21:45 PM    Final         Scheduled Meds:  azithromycin  500 mg Oral Daily   clotrimazole  10 mg Oral 5 X Daily   enoxaparin (LOVENOX) injection  60 mg Subcutaneous Q24H   furosemide  20 mg Oral Daily   levalbuterol  0.63 mg Nebulization TID   Continuous Infusions:  sodium chloride 10 mL/hr at 02/10/21 0931   cefTRIAXone (ROCEPHIN)  IV 2 g (02/09/21 2201)   metronidazole 500 mg (02/10/21 0932)     LOS: 2 days    Time spent: over 30 min    Fayrene Helper, MD [facility]   To contact the attending provider between 7A-7P or the covering provider during after hours 7P-7A, please log into the web site [email or web address] and access using universal [employer] password for that web site. If you do not have the password, please call the hospital operator.  02/10/2021, 6:52 PM

## 2021-02-10 NOTE — Progress Notes (Signed)
Echocardiogram 2D Echocardiogram has been performed.  Devonne Doughty 02/10/2021, 11:36 AM

## 2021-02-10 NOTE — Progress Notes (Signed)
°   02/10/21 1300  Mobility  Activity Ambulated independently in hallway  Range of Motion/Exercises Active;All extremities  Level of Assistance Independent  Assistive Device None  Distance Ambulated (ft) 720 ft  Activity Response Tolerated well   Layla Maw, RN

## 2021-02-11 ENCOUNTER — Inpatient Hospital Stay (HOSPITAL_COMMUNITY): Payer: Medicaid Other

## 2021-02-11 LAB — COMPREHENSIVE METABOLIC PANEL
ALT: 75 U/L — ABNORMAL HIGH (ref 0–44)
AST: 63 U/L — ABNORMAL HIGH (ref 15–41)
Albumin: 2.4 g/dL — ABNORMAL LOW (ref 3.5–5.0)
Alkaline Phosphatase: 71 U/L (ref 38–126)
Anion gap: 8 (ref 5–15)
BUN: 7 mg/dL (ref 6–20)
CO2: 24 mmol/L (ref 22–32)
Calcium: 8.1 mg/dL — ABNORMAL LOW (ref 8.9–10.3)
Chloride: 101 mmol/L (ref 98–111)
Creatinine, Ser: 0.56 mg/dL (ref 0.44–1.00)
GFR, Estimated: >60 mL/min (ref >60)
Glucose, Bld: 86 mg/dL (ref 70–99)
Potassium: 2.9 mmol/L — ABNORMAL LOW (ref 3.5–5.1)
Sodium: 133 mmol/L — ABNORMAL LOW (ref 135–145)
Total Bilirubin: 1.5 mg/dL — ABNORMAL HIGH (ref 0.3–1.2)
Total Protein: 5.7 g/dL — ABNORMAL LOW (ref 6.5–8.1)

## 2021-02-11 LAB — CBC WITH DIFFERENTIAL/PLATELET
Abs Immature Granulocytes: 0.79 10*3/uL — ABNORMAL HIGH (ref 0.00–0.07)
Basophils Absolute: 0 10*3/uL (ref 0.0–0.1)
Basophils Relative: 0 %
Eosinophils Absolute: 0.3 10*3/uL (ref 0.0–0.5)
Eosinophils Relative: 3 %
HCT: 34.7 % — ABNORMAL LOW (ref 36.0–46.0)
Hemoglobin: 11.3 g/dL — ABNORMAL LOW (ref 12.0–15.0)
Immature Granulocytes: 9 %
Lymphocytes Relative: 37 %
Lymphs Abs: 3.3 10*3/uL (ref 0.7–4.0)
MCH: 29.6 pg (ref 26.0–34.0)
MCHC: 32.6 g/dL (ref 30.0–36.0)
MCV: 90.8 fL (ref 80.0–100.0)
Monocytes Absolute: 0.8 10*3/uL (ref 0.1–1.0)
Monocytes Relative: 9 %
Neutro Abs: 3.6 10*3/uL (ref 1.7–7.7)
Neutrophils Relative %: 42 %
Platelets: 155 10*3/uL (ref 150–400)
RBC: 3.82 MIL/uL — ABNORMAL LOW (ref 3.87–5.11)
RDW: 13.6 % (ref 11.5–15.5)
WBC: 8.8 10*3/uL (ref 4.0–10.5)
nRBC: 0 % (ref 0.0–0.2)

## 2021-02-11 LAB — HEPATITIS PANEL, ACUTE
HCV Ab: NONREACTIVE
Hep A IgM: NONREACTIVE
Hep B C IgM: NONREACTIVE
Hepatitis B Surface Ag: NONREACTIVE

## 2021-02-11 LAB — PROCALCITONIN: Procalcitonin: 1.5 ng/mL

## 2021-02-11 LAB — PHOSPHORUS: Phosphorus: 2.7 mg/dL (ref 2.5–4.6)

## 2021-02-11 LAB — MAGNESIUM: Magnesium: 1.5 mg/dL — ABNORMAL LOW (ref 1.7–2.4)

## 2021-02-11 MED ORDER — LEVALBUTEROL HCL 0.63 MG/3ML IN NEBU: 0.6300 mg | INHALATION_SOLUTION | Freq: Four times a day (QID) | RESPIRATORY_TRACT | Status: DC | PRN

## 2021-02-11 MED ORDER — AZITHROMYCIN 500 MG PO TABS: 500.0000 mg | ORAL_TABLET | Freq: Every day | ORAL | 0 refills | Status: AC

## 2021-02-11 MED ORDER — CEFPODOXIME PROXETIL 200 MG PO TABS: 200.0000 mg | ORAL_TABLET | Freq: Two times a day (BID) | ORAL | 0 refills | Status: AC

## 2021-02-11 MED ORDER — POTASSIUM CHLORIDE CRYS ER 20 MEQ PO TBCR
40.0000 meq | EXTENDED_RELEASE_TABLET | Freq: Once | ORAL | Status: AC
  Administered 2021-02-11: 40 meq via ORAL
  Filled 2021-02-11: qty 2

## 2021-02-11 MED ORDER — POTASSIUM CHLORIDE CRYS ER 20 MEQ PO TBCR
40.0000 meq | EXTENDED_RELEASE_TABLET | ORAL | Status: AC
  Administered 2021-02-11 (×2): 40 meq via ORAL
  Filled 2021-02-11 (×2): qty 2

## 2021-02-11 MED ORDER — MAGNESIUM SULFATE 4 GM/100ML IV SOLN
4.0000 g | Freq: Once | INTRAVENOUS | Status: AC
  Administered 2021-02-11: 4 g via INTRAVENOUS
  Filled 2021-02-11: qty 100

## 2021-02-11 MED ORDER — ALBUTEROL SULFATE HFA 108 (90 BASE) MCG/ACT IN AERS: 2.0000 | INHALATION_SPRAY | Freq: Four times a day (QID) | RESPIRATORY_TRACT | 0 refills | Status: DC | PRN

## 2021-02-11 NOTE — Discharge Summary (Addendum)
Physician Discharge Summary  Beverly Clark WJX:[id number] DOB: January 17, 2001 DOA: 02/08/2021  PCP: Pcp, No  Admit date: 02/08/2021 Discharge date: 02/11/2021  Time spent: 40 minutes  Recommendations for Outpatient Follow-up:  Follow outpatient CBC with diff and CMP Follow repeat UA outpatient - protein, microscopic hematuria here Follow resp status outpatient and sinus tachycardia - improving at discharge Follow elevated LFT's outpatient - suspect related to fatty liver and acute illness Follow tattoo is healing well Follow mediastinal and axillary lymphadenopathy outpatient Needs follow up chest imaging outpatient    Discharge Diagnoses:  Principal Problem:   Sepsis due to pneumonia [facility]) Active Problems:   AKI (acute kidney injury) ([name])   Sinus tachycardia   Shortness of breath   Tattoo of skin   Hyponatremia   Hypokalemia   Dehydration   Hypomagnesemia   Diarrhea   Elevated liver enzymes   Lactic acidosis   Lymphadenopathy   Foley catheter in place   Discharge Condition: stable  Diet recommendation: heart healthy  Filed Weights   02/08/21 1502 02/09/21 1603  Weight: 110 kg 121.7 kg    History of present illness:  21 yo who presented with fevers, chills, diarrhea initially treated for presumed infected tattoo.  Now she's returned with AKI, shortness of breath, and imaging findings concerning for pneumonia.  She's improved with IV abx and IV fluids.  See below for additional details  Hospital Course:  * Sepsis due to pneumonia ([place]) Fever, tachycardia, tachypnea At this time, with CT scan concerning for bronchopneumonia or aspiration -> community acquired pneumonia seems to be most likely cause for her presentation with fever and shortness of breath CXR with findings concerning for pneumonia vs edema CXR 1/27 with improving interstitial opacities RVP negative Negative covid, influenza MRSA PCR pending Negative HIV.  Negative UDS.  TSH wnl. Elevated ESR,  CRP ANA negative UA not c/w UTI, follow culture NG Blood cultures NG Negative strep.  Pending legionella.  Pending sputum cx (not collected). Discharge on vantin and azithro given penicillin allergy.  Can d/c flagyl. Procalcitonin is significantly elevated, 19.2, supports concern for bacterial infection.  Improving  WBC is normal, which is surprising given her acute illness.     Sinus tachycardia Improved, now in the 90's at rest Continue to follow outpatient, expect this to improve with continued improvement of pneumonia   AKI (acute kidney injury) ([place])- (present on admission) Creatinine peaked at 5 improved Continue to follow I/O UA with 30 mg/dl protein, 6-10 RBC's, 21-50 wbc's repeat outpatient given protein and microscopic hematuria  Tattoo of skin Potential source of infection on right thigh Mild erythema/warmth, but no pain Follow outpatient   Shortness of breath Due to pneumonia CT as above CXR with pulm edema and possible pneumonia Echo with EF 60-65%, no RWMA She's also improved with nebs, follow closely Given low dose lasix x2 doses  Elevated liver enzymes Hemodynamically mediated? Shock liver? Fatty liver? Bili mildly elevated (no biliary dilatation on CT) Negative acute hepatitis panel. Follow outpatient  Diarrhea- (present on admission) Negative GI path panel and C diff  Hypomagnesemia- (present on admission) Replace and follow  Hypokalemia- (present on admission) Replace and follow outpatient (worsened today with lasix, replete)  Hyponatremia- (present on admission) improving  Lactic acidosis- (present on admission) improved  Lymphadenopathy Reactive? Follow outpatient  Foley catheter in place D/c foley, follow   Procedures: Echo IMPRESSIONS     1. Left ventricular ejection fraction, by estimation, is 60 to 65%. The  left ventricle has normal  function. The left ventricle has no regional  wall motion abnormalities. Left  ventricular diastolic parameters were  normal.   2. Right ventricular systolic function is normal. The right ventricular  size is normal. There is normal pulmonary artery systolic pressure. The  estimated right ventricular systolic pressure is 40.9 mmHg.   3. The mitral valve is normal in structure. No evidence of mitral valve  regurgitation. No evidence of mitral stenosis.   4. The aortic valve is normal in structure. Aortic valve regurgitation is  not visualized. No aortic stenosis is present.   5. The inferior vena cava is normal in size with greater than 50%  respiratory variability, suggesting right atrial pressure of 3 mmHg.    Consultations: none  Discharge Exam: Vitals:   02/11/21 0515 02/11/21 0827  BP: (!) 147/88   Pulse: 96   Resp: (!) 21   Temp: 98.2 F (36.8 C)   SpO2: 94% 95%   Feels better Ok with going home Discussed with mother  General: No acute distress. Cardiovascular: Heart sounds show Beverly Clark regular rate, and rhythm. Lungs: Clear to auscultation bilaterally  Abdomen: Soft, nontender, nondistended  Neurological: Alert and oriented 3. Moves all extremities 4 . Cranial nerves II through XII grossly intact. Skin: Warm and dry. No rashes or lesions. Extremities: No clubbing or cyanosis. No edema. Discharge Instructions   Discharge Instructions     Call MD for:  difficulty breathing, headache or visual disturbances   Complete by: As directed    Call MD for:  extreme fatigue   Complete by: As directed    Call MD for:  hives   Complete by: As directed    Call MD for:  persistant dizziness or light-headedness   Complete by: As directed    Call MD for:  persistant nausea and vomiting   Complete by: As directed    Call MD for:  redness, tenderness, or signs of infection (pain, swelling, redness, odor or green/yellow discharge around incision site)   Complete by: As directed    Call MD for:  severe uncontrolled pain   Complete by: As directed    Call MD  for:  temperature >100.4   Complete by: As directed    Diet - low sodium heart healthy   Complete by: As directed    Discharge instructions   Complete by: As directed    You were seen for pneumonia and acute kidney injury.  Your kidney function has improved.  We'll send you with Tramond Slinker few more days of antibiotics for your pneumonia.  We'll send you with vantin and azithromycin prescriptions.  I'll also send you with albuterol inhaler to use as needed.  I'll give you paper prescriptions for these medicines with goodrx coupons.  You've had your antibiotics today, so you can pick these medicines up tonight or tomorrow and start taking your medicines tomorrow.  You'll need repeat labs on Monday to follow your kidney function and electrolytes.  Your liver function was abnormal, I think because of your illness.  Your CT scan also showed findings of fatty liver disease which can predispose you to liver injury.  Follow these labs up with your PCP Monday.  Your urine studies were abnormal and should be repeated outpatient given the presence of protein and microscopic blood.  You'll need follow up chest imaging.  Your CT scan showed lymph nodes that should be followed up.  Your fast heart rate should gradually improve with time.  Return for new, recurrent, or worsening  symptoms.  Please ask your PCP to request records from this hospitalization so they know what was done and what the next steps will be.   Increase activity slowly   Complete by: As directed       Allergies as of 02/11/2021       Reactions   Penicillins Hives        Medication List     STOP taking these medications    clindamycin 300 MG capsule Commonly known as: Cleocin       TAKE these medications    albuterol 108 (90 Base) MCG/ACT inhaler Commonly known as: VENTOLIN HFA Inhale 2 puffs into the lungs every 6 (six) hours as needed for wheezing or shortness of breath.   azithromycin 500 MG tablet Commonly known as:  Zithromax Take 1 tablet (500 mg total) by mouth daily for 2 days.   cefpodoxime 200 MG tablet Commonly known as: VANTIN Take 1 tablet (200 mg total) by mouth 2 (two) times daily for 4 days.   ondansetron 4 MG disintegrating tablet Commonly known as: ZOFRAN-ODT Take 1 tablet (4 mg total) by mouth every 8 (eight) hours as needed for nausea or vomiting.       Allergies  Allergen Reactions   Penicillins Hives      The results of significant diagnostics from this hospitalization (including imaging, microbiology, ancillary and laboratory) are listed below for reference.    Significant Diagnostic Studies: CT ABDOMEN PELVIS WO CONTRAST  Result Date: 02/08/2021 CLINICAL DATA:  Bladder neck obstruction, rule out obstructing stone. EXAM: CT ABDOMEN AND PELVIS WITHOUT CONTRAST TECHNIQUE: Multidetector CT imaging of the abdomen and pelvis was performed following the standard protocol without IV contrast. RADIATION DOSE REDUCTION: This exam was performed according to the departmental dose-optimization program which includes automated exposure control, adjustment of the mA and/or kV according to patient size and/or use of iterative reconstruction technique. COMPARISON:  None. FINDINGS: Lower chest: Streaky and linear ground-glass opacities in the lung bases, typical of scarring and hypoventilatory change. Trace bilateral pleural thickening without significant effusion. Hepatobiliary: Decreased hepatic density typical of steatosis no focal hepatic lesion on this unenhanced exam. Gallbladder is poorly defined, tentatively visualized. No calcified gallstone. No biliary dilatation. Pancreas: No ductal dilatation or inflammation. Spleen: Normal in size without focal abnormality. Splenule anteriorly. Adrenals/Urinary Tract: No adrenal nodule. No hydronephrosis or renal calculi. No significant perinephric edema. There is indistinctness of the right renal sinus fat. Both ureters are decompressed without stones  along the course. Urinary bladder is physiologically distended. There is no bladder stone. No bladder wall thickening or evidence of focal mass on this unenhanced exam. No urethral stone is visualized. Stomach/Bowel: Stomach is within normal limits. Appendix appears normal. No evidence of bowel wall thickening, distention, or inflammatory changes. Vascular/Lymphatic: Normal caliber abdominal aorta. No portal venous or mesenteric gas. Scattered small retroperitoneal and central mesenteric nodes, not enlarged by size criteria, typically reactive. Reproductive: Anteverted uterus. 2.7 cm follicular cyst in the left ovary is physiologic. No dedicated further evaluation is needed. Normal CT appearance of the right ovary. No adnexal mass. Other: No free air, free fluid, or intra-abdominal fluid collection. Small fat containing umbilical hernia. Musculoskeletal: There are no acute or suspicious osseous abnormalities. IMPRESSION: 1. No urolithiasis or obstructive uropathy. 2. Indistinct right renal sinus fat, can be seen with urinary tract infection. Recommend correlation with urinalysis. 3. Hepatic steatosis. 4. Small fat containing umbilical hernia. Electronically Signed   By: Keith Rake M.D.   On: 02/08/2021 19:35  DG Chest 1 View  Result Date: 02/07/2021 CLINICAL DATA:  21 year old female with fever of unknown origin. EXAM: CHEST  1 VIEW COMPARISON:  None. FINDINGS: PA view at 0520 hours. Normal lung volumes and mediastinal contours. Visualized tracheal air column is within normal limits. Mild hair artifact. Lung markings are within normal limits, both lungs appear clear. No pneumothorax or pleural effusion. Negative visible bowel gas and osseous structures. IMPRESSION: Negative.  No cardiopulmonary abnormality. Electronically Signed   By: Genevie Ann M.D.   On: 02/07/2021 06:24   CT CHEST WO CONTRAST  Result Date: 02/09/2021 CLINICAL DATA:  Fever with chest pain and shortness of breath. EXAM: CT CHEST WITHOUT  CONTRAST TECHNIQUE: Multidetector CT imaging of the chest was performed following the standard protocol without IV contrast. RADIATION DOSE REDUCTION: This exam was performed according to the departmental dose-optimization program which includes automated exposure control, adjustment of the mA and/or kV according to patient size and/or use of iterative reconstruction technique. COMPARISON:  Chest radiographs 02/08/2021 and 02/07/2021. Abdominopelvic CT 02/08/2021. FINDINGS: Cardiovascular: No significant vascular findings on noncontrast imaging. The heart size is normal. There is no pericardial effusion. Mediastinum/Nodes: There are no enlarged mediastinal, hilar or axillary lymph nodes.There are multiple small mediastinal and hilar lymph nodes bilaterally which do not appear pathologically enlarged. Hilar assessment is limited by the lack of intravenous contrast. The thyroid gland, trachea and esophagus demonstrate no significant findings. Lungs/Pleura: No pleural effusion or pneumothorax. Streaky and bandlike opacities dependently within both lower lobes have progressed compared with yesterday's abdominal CT. There is associated diffuse central airway thickening. No consolidation or suspicious pulmonary nodule. Upper abdomen: Diffuse low-density again noted throughout the liver consistent with steatosis. No acute findings are seen within the visualized upper abdomen. Musculoskeletal/Chest wall: There is no chest wall mass or suspicious osseous finding. IMPRESSION: 1. Increased streaky and bandlike opacities in both lower lobes compared with abdominal CT performed yesterday. Although possibly due to atelectasis, in the setting of fever, findings could reflect bronchopneumonia or aspiration. Recommend chest radiographic follow-up. 2. No pleural effusion. 3. Mildly prominent axillary and mediastinal lymph nodes bilaterally, likely reactive. 4. Hepatic steatosis. Electronically Signed   By: Richardean Sale M.D.   On:  02/09/2021 11:09   US RENAL  Result Date: 02/08/2021 CLINICAL DATA:  Urinary tract obstruction. EXAM: RENAL / URINARY TRACT ULTRASOUND COMPLETE COMPARISON:  CT abdomen pelvis dated 02/08/2021. FINDINGS: Right Kidney: Renal measurements: 13.3 x 5.7 x 7.2 cm = volume: 284 mL. Increased echogenicity. No hydronephrosis or shadowing stone. Left Kidney: Renal measurements: 14.0 x 7.3 x 7.4 cm = volume: 392 mL. Increased echogenicity. No hydronephrosis or shadowing stone. Bladder: Appears normal for degree of bladder distention. Other: None. IMPRESSION: Mildly echogenic kidneys may represent medical renal disease. No hydronephrosis or shadowing stone. Electronically Signed   By: Anner Crete M.D.   On: 02/08/2021 19:58   DG CHEST PORT 1 VIEW  Result Date: 02/11/2021 CLINICAL DATA:  Pneumonia EXAM: PORTABLE CHEST 1 VIEW COMPARISON:  02/10/2021 FINDINGS: Stable cardiomediastinal contours. Low lung volumes. Bilateral interstitial opacities, most confluent in the right lung base. Overall, findings are improving from previous radiograph. No pleural effusion or pneumothorax. IMPRESSION: Improving bilateral interstitial opacities. Electronically Signed   By: Davina Poke D.O.   On: 02/11/2021 13:11   DG CHEST PORT 1 VIEW  Result Date: 02/10/2021 CLINICAL DATA:  Shortness of breath. EXAM: PORTABLE CHEST 1 VIEW COMPARISON:  February 08, 2021. FINDINGS: The heart size and mediastinal contours are within normal limits.  Interval development of bilateral perihilar and basilar opacities are noted concerning for pulmonary edema or possibly pneumonia. The visualized skeletal structures are unremarkable. IMPRESSION: Interval development of bilateral perihilar and basilar opacities are noted concerning for pulmonary edema or possibly pneumonia. Electronically Signed   By: Marijo Conception M.D.   On: 02/10/2021 08:26   DG Chest Port 1 View  Result Date: 02/08/2021 CLINICAL DATA:  Questionable sepsis EXAM: PORTABLE CHEST  1 VIEW COMPARISON:  February 07, 2021 FINDINGS: The heart size and mediastinal contours are within normal limits. Both lungs are clear. The visualized skeletal structures are unremarkable. IMPRESSION: No active disease. Electronically Signed   By: Dahlia Bailiff M.D.   On: 02/08/2021 15:31   ECHOCARDIOGRAM COMPLETE  Result Date: 02/10/2021    ECHOCARDIOGRAM REPORT   Patient Name:   AMBERLI RUEGG Date of Exam: 02/10/2021 Medical Rec #:  [id number]        Height:       66.0 in Accession #:    [id number]       Weight:       268.2 lb Date of Birth:  2000/06/15         BSA:          2.265 m Patient Age:    21 years         BP:           148/84 mmHg Patient Gender: F                HR:           108 bpm. Exam Location:  Inpatient Procedure: 2D Echo Indications:    Shortness of breath  History:        Patient has no prior history of Echocardiogram examinations.  Sonographer:    Arlyss Gandy Referring Phys: [place]  1. Left ventricular ejection fraction, by estimation, is 60 to 65%. The left ventricle has normal function. The left ventricle has no regional wall motion abnormalities. Left ventricular diastolic parameters were normal.  2. Right ventricular systolic function is normal. The right ventricular size is normal. There is normal pulmonary artery systolic pressure. The estimated right ventricular systolic pressure is 63.3 mmHg.  3. The mitral valve is normal in structure. No evidence of mitral valve regurgitation. No evidence of mitral stenosis.  4. The aortic valve is normal in structure. Aortic valve regurgitation is not visualized. No aortic stenosis is present.  5. The inferior vena cava is normal in size with greater than 50% respiratory variability, suggesting right atrial pressure of 3 mmHg. FINDINGS  Left Ventricle: Left ventricular ejection fraction, by estimation, is 60 to 65%. The left ventricle has normal function. The left ventricle has no regional wall motion  abnormalities. The left ventricular internal cavity size was normal in size. There is  no left ventricular hypertrophy. Left ventricular diastolic parameters were normal. Normal left ventricular filling pressure. Right Ventricle: The right ventricular size is normal. No increase in right ventricular wall thickness. Right ventricular systolic function is normal. There is normal pulmonary artery systolic pressure. The tricuspid regurgitant velocity is 2.23 m/s, and  with an assumed right atrial pressure of 3 mmHg, the estimated right ventricular systolic pressure is 35.4 mmHg. Left Atrium: Left atrial size was normal in size. Right Atrium: Right atrial size was normal in size. Pericardium: There is no evidence of pericardial effusion. Mitral Valve: The mitral valve is normal in structure. No evidence of mitral valve regurgitation.  No evidence of mitral valve stenosis. Tricuspid Valve: The tricuspid valve is normal in structure. Tricuspid valve regurgitation is trivial. No evidence of tricuspid stenosis. Aortic Valve: The aortic valve is normal in structure. Aortic valve regurgitation is not visualized. No aortic stenosis is present. Aortic valve mean gradient measures 6.0 mmHg. Aortic valve peak gradient measures 13.2 mmHg. Aortic valve area, by VTI measures 2.23 cm. Pulmonic Valve: The pulmonic valve was normal in structure. Pulmonic valve regurgitation is trivial. No evidence of pulmonic stenosis. Aorta: The aortic root is normal in size and structure. Venous: The inferior vena cava is normal in size with greater than 50% respiratory variability, suggesting right atrial pressure of 3 mmHg. IAS/Shunts: No atrial level shunt detected by color flow Doppler.  LEFT VENTRICLE PLAX 2D LVIDd:         4.40 cm   Diastology LVIDs:         3.00 cm   LV e' medial:    16.00 cm/s LV PW:         0.90 cm   LV E/e' medial:  7.2 LV IVS:        0.90 cm   LV e' lateral:   16.90 cm/s LVOT diam:     2.00 cm   LV E/e' lateral: 6.9 LV SV:          69 LV SV Index:   30 LVOT Area:     3.14 cm  RIGHT VENTRICLE             IVC RV Basal diam:  3.20 cm     IVC diam: 1.70 cm RV Mid diam:    2.60 cm RV S prime:     12.90 cm/s TAPSE (M-mode): 2.9 cm LEFT ATRIUM             Index        RIGHT ATRIUM           Index LA diam:        3.30 cm 1.46 cm/m   RA Area:     16.60 cm LA Vol (A2C):   37.3 ml 16.47 ml/m  RA Volume:   42.40 ml  18.72 ml/m LA Vol (A4C):   37.2 ml 16.42 ml/m LA Biplane Vol: 39.7 ml 17.52 ml/m  AORTIC VALVE AV Area (Vmax):    2.19 cm AV Area (Vmean):   2.22 cm AV Area (VTI):     2.23 cm AV Vmax:           182.00 cm/s AV Vmean:          114.000 cm/s AV VTI:            0.308 m AV Peak Grad:      13.2 mmHg AV Mean Grad:      6.0 mmHg LVOT Vmax:         127.00 cm/s LVOT Vmean:        80.700 cm/s LVOT VTI:          0.219 m LVOT/AV VTI ratio: 0.71  AORTA Ao Root diam: 2.40 cm Ao Asc diam:  2.20 cm MITRAL VALVE                TRICUSPID VALVE MV Area (PHT): 6.71 cm     TR Peak grad:   19.9 mmHg MV Decel Time: 113 msec     TR Vmax:        223.00 cm/s MV E velocity: 116.00 cm/s MV Jozi Malachi velocity: 97.70 cm/s  SHUNTS MV E/Aran Menning ratio:  1.19         Systemic VTI:  0.22 m                             Systemic Diam: 2.00 cm Fransico Him MD Electronically signed by Fransico Him MD Signature Date/Time: 02/10/2021/12:21:45 PM    Final     Microbiology: Recent Results (from the past 240 hour(s))  Resp Panel by RT-PCR (Flu Kelvon Giannini&B, Covid) Nasopharyngeal Swab     Status: None   Collection Time: 02/06/21  6:26 PM   Specimen: Nasopharyngeal Swab; Nasopharyngeal(NP) swabs in vial transport medium  Result Value Ref Range Status   SARS Coronavirus 2 by RT PCR NEGATIVE NEGATIVE Final    Comment: (NOTE) SARS-CoV-2 target nucleic acids are NOT DETECTED.  The SARS-CoV-2 RNA is generally detectable in upper respiratory specimens during the acute phase of infection. The lowest concentration of SARS-CoV-2 viral copies this assay can detect is 138 copies/mL. Emmarae Cowdery  negative result does not preclude SARS-Cov-2 infection and should not be used as the sole basis for treatment or other patient management decisions. Quinette Hentges negative result may occur with  improper specimen collection/handling, submission of specimen other than nasopharyngeal swab, presence of viral mutation(s) within the areas targeted by this assay, and inadequate number of viral copies(<138 copies/mL). Solaris Kram negative result must be combined with clinical observations, patient history, and epidemiological information. The expected result is Negative.  Fact Sheet for Patients:  EntrepreneurPulse.com.au  Fact Sheet for Healthcare Providers:  IncredibleEmployment.be  This test is no t yet approved or cleared by the [place] FDA and  has been authorized for detection and/or diagnosis of SARS-CoV-2 by FDA under an Emergency Use Authorization (EUA). This EUA will remain  in effect (meaning this test can be used) for the duration of the COVID-19 declaration under Section 564(b)(1) of the Act, 21 U.S.C.section 360bbb-3(b)(1), unless the authorization is terminated  or revoked sooner.       Influenza Avonlea Sima by PCR NEGATIVE NEGATIVE Final   Influenza B by PCR NEGATIVE NEGATIVE Final    Comment: (NOTE) The Xpert Xpress SARS-CoV-2/FLU/RSV plus assay is intended as an aid in the diagnosis of influenza from Nasopharyngeal swab specimens and should not be used as Maleena Eddleman sole basis for treatment. Nasal washings and aspirates are unacceptable for Xpert Xpress SARS-CoV-2/FLU/RSV testing.  Fact Sheet for Patients: EntrepreneurPulse.com.au  Fact Sheet for Healthcare Providers: IncredibleEmployment.be  This test is not yet approved or cleared by the [place] FDA and has been authorized for detection and/or diagnosis of SARS-CoV-2 by FDA under an Emergency Use Authorization (EUA). This EUA will remain in effect (meaning this test can  be used) for the duration of the COVID-19 declaration under Section 564(b)(1) of the Act, 21 U.S.C. section 360bbb-3(b)(1), unless the authorization is terminated or revoked.  Performed at [facility], [place] [address], [place], [place] [address]   Resp Panel by RT-PCR (Flu Shalamar Crays&B, Covid) Nasopharyngeal Swab     Status: None   Collection Time: 02/08/21  5:23 PM   Specimen: Nasopharyngeal Swab; Nasopharyngeal(NP) swabs in vial transport medium  Result Value Ref Range Status   SARS Coronavirus 2 by RT PCR NEGATIVE NEGATIVE Final    Comment: (NOTE) SARS-CoV-2 target nucleic acids are NOT DETECTED.  The SARS-CoV-2 RNA is generally detectable in upper respiratory specimens during the acute phase of infection. The lowest concentration of SARS-CoV-2 viral copies this assay can detect is  138 copies/mL. Danissa Rundle negative result does not preclude SARS-Cov-2 infection and should not be used as the sole basis for treatment or other patient management decisions. Xavious Sharrar negative result may occur with  improper specimen collection/handling, submission of specimen other than nasopharyngeal swab, presence of viral mutation(s) within the areas targeted by this assay, and inadequate number of viral copies(<138 copies/mL). Roger Fasnacht negative result must be combined with clinical observations, patient history, and epidemiological information. The expected result is Negative.  Fact Sheet for Patients:  EntrepreneurPulse.com.au  Fact Sheet for Healthcare Providers:  IncredibleEmployment.be  This test is no t yet approved or cleared by the [place] FDA and  has been authorized for detection and/or diagnosis of SARS-CoV-2 by FDA under an Emergency Use Authorization (EUA). This EUA will remain  in effect (meaning this test can be used) for the duration of the COVID-19 declaration under Section 564(b)(1) of the Act, 21 U.S.C.section 360bbb-3(b)(1), unless the authorization is  terminated  or revoked sooner.       Influenza Amilah Greenspan by PCR NEGATIVE NEGATIVE Final   Influenza B by PCR NEGATIVE NEGATIVE Final    Comment: (NOTE) The Xpert Xpress SARS-CoV-2/FLU/RSV plus assay is intended as an aid in the diagnosis of influenza from Nasopharyngeal swab specimens and should not be used as Lanika Colgate sole basis for treatment. Nasal washings and aspirates are unacceptable for Xpert Xpress SARS-CoV-2/FLU/RSV testing.  Fact Sheet for Patients: EntrepreneurPulse.com.au  Fact Sheet for Healthcare Providers: IncredibleEmployment.be  This test is not yet approved or cleared by the [place] FDA and has been authorized for detection and/or diagnosis of SARS-CoV-2 by FDA under an Emergency Use Authorization (EUA). This EUA will remain in effect (meaning this test can be used) for the duration of the COVID-19 declaration under Section 564(b)(1) of the Act, 21 U.S.C. section 360bbb-3(b)(1), unless the authorization is terminated or revoked.  Performed at [facility], [place] [address], [place], [place] [address]   Blood Culture (routine x 2)     Status: None (Preliminary result)   Collection Time: 02/08/21  5:23 PM   Specimen: BLOOD  Result Value Ref Range Status   Specimen Description   Final    BLOOD LEFT ANTECUBITAL Performed at [place] [address], [place], [place] [address]    Special Requests   Final    BOTTLES DRAWN AEROBIC AND ANAEROBIC Blood Culture results may not be optimal due to an inadequate volume of blood received in culture bottles Performed at [place] [address], [place], [place] [address]    Culture   Final    NO GROWTH 3 DAYS Performed at [facility], [place] [address], [place], [place] [address]    Report Status PENDING  Incomplete  Blood Culture (routine x 2)     Status: None (Preliminary result)   Collection Time: 02/08/21  7:33 PM    Specimen: BLOOD  Result Value Ref Range Status   Specimen Description   Final    BLOOD BLOOD LEFT HAND Performed at [facility] [address] [place], [place] [address]    Special Requests   Final    BOTTLES DRAWN AEROBIC ONLY Blood Culture adequate volume Performed at [place] [address], [place], [place] [address]    Culture   Final    NO GROWTH 2 DAYS Performed at [facility] [address] [place], [place] [address]    Report Status PENDING  Incomplete  Urine Culture     Status: None   Collection Time: 02/08/21  8:40 PM   Specimen: In/Out Cath Urine  Result Value Ref Range Status   Specimen Description   Final    IN/OUT CATH URINE Performed at [facility], [place] [address] [place], [place] [address]    Special Requests   Final    NONE Performed at [facility], [place] [address], [place], [place] [address]    Culture   Final    NO GROWTH Performed at [facility], [place] [address], [place], [place] [address]    Report Status 02/10/2021 FINAL  Final  Respiratory (~20 pathogens) panel by PCR     Status: None   Collection Time: 02/08/21 10:43 PM   Specimen: Nasopharyngeal Swab; Respiratory  Result Value Ref Range Status   Adenovirus NOT DETECTED NOT DETECTED Final   Coronavirus 229E NOT DETECTED NOT DETECTED Final    Comment: (NOTE) The Coronavirus on the Respiratory Panel, DOES NOT test for the novel  Coronavirus (2019 nCoV)    Coronavirus HKU1 NOT DETECTED NOT DETECTED Final   Coronavirus NL63 NOT DETECTED NOT DETECTED Final   Coronavirus OC43 NOT DETECTED NOT DETECTED Final   Metapneumovirus NOT DETECTED NOT DETECTED Final   Rhinovirus / Enterovirus NOT DETECTED NOT DETECTED Final   Influenza Cynthis Purington NOT DETECTED NOT DETECTED Final   Influenza B NOT DETECTED NOT DETECTED Final   Parainfluenza Virus 1 NOT DETECTED NOT DETECTED Final   Parainfluenza Virus 2 NOT DETECTED NOT  DETECTED Final   Parainfluenza Virus 3 NOT DETECTED NOT DETECTED Final   Parainfluenza Virus 4 NOT DETECTED NOT DETECTED Final   Respiratory Syncytial Virus NOT DETECTED NOT DETECTED Final   Bordetella pertussis NOT DETECTED NOT DETECTED Final   Bordetella Parapertussis NOT DETECTED NOT DETECTED Final   Chlamydophila pneumoniae NOT DETECTED NOT DETECTED Final   Mycoplasma pneumoniae NOT DETECTED NOT DETECTED Final    Comment: Performed at [facility], [place]. [address] [place], [place] [address]  Gastrointestinal Panel by PCR , Stool     Status: None   Collection Time: 02/09/21  7:35 AM   Specimen: Stool  Result Value Ref Range Status   Campylobacter species NOT DETECTED NOT DETECTED Final   Plesimonas shigelloides NOT DETECTED NOT DETECTED Final   Salmonella species NOT DETECTED NOT DETECTED Final   Yersinia enterocolitica NOT DETECTED NOT DETECTED Final   Vibrio species NOT DETECTED NOT DETECTED Final   Vibrio cholerae NOT DETECTED NOT DETECTED Final   Enteroaggregative E coli (EAEC) NOT DETECTED NOT DETECTED Final   Enteropathogenic E coli (EPEC) NOT DETECTED NOT DETECTED Final   Enterotoxigenic E coli (ETEC) NOT DETECTED NOT DETECTED Final   Shiga like toxin producing E coli (STEC) NOT DETECTED NOT DETECTED Final   Shigella/Enteroinvasive E coli (EIEC) NOT DETECTED NOT DETECTED Final   Cryptosporidium NOT DETECTED NOT DETECTED Final   Cyclospora cayetanensis NOT DETECTED NOT DETECTED Final   Entamoeba histolytica NOT DETECTED NOT DETECTED Final   Giardia lamblia NOT DETECTED NOT DETECTED Final   Adenovirus F40/41 NOT DETECTED NOT DETECTED Final   Astrovirus NOT DETECTED NOT DETECTED Final   Norovirus GI/GII NOT DETECTED NOT DETECTED Final   Rotavirus Valen Gillison NOT DETECTED NOT DETECTED Final   Sapovirus (I, II, IV, and V) NOT DETECTED NOT DETECTED Final    Comment: Performed at [facility], [address], [place], [place] [address]  C Difficile Quick Screen w PCR  reflex      Status: None   Collection Time: 02/09/21  7:35 AM   Specimen: Stool  Result Value Ref Range Status   C Diff antigen NEGATIVE NEGATIVE Final   C Diff toxin NEGATIVE NEGATIVE Final   C Diff interpretation No C. difficile detected.  Final    Comment: Performed at [facility], [place] [address], [place], [place] [address]     Labs: Basic Metabolic Panel: Recent Labs  Lab 02/08/21 2040 02/09/21 0102 02/09/21 0536 02/09/21 1130 02/09/21 1642 02/10/21 0343 02/11/21 0408  NA 128*   < > 133* 131* 134* 137 133*  K 3.3*   < > 3.6 3.4* 3.7 3.5 2.9*  CL 97*   < > 103 103 105 107 101  CO2 18*   < > 19* 20* 20* 23 24  GLUCOSE 81   < > 108* 102* 87 87 86  BUN 57*   < > 41* 28* 22* 12 7  CREATININE 4.97*   < > 2.08* 1.24* 1.07* 0.63 0.56  CALCIUM 7.7*   < > 8.1* 8.1* 8.3* 8.3* 8.1*  MG 1.3*  --  1.9  --   --  1.9 1.5*  PHOS 3.5  --  1.8*  --   --  2.2* 2.7   < > = values in this interval not displayed.   Liver Function Tests: Recent Labs  Lab 02/08/21 2040 02/09/21 0536 02/09/21 1642 02/10/21 0343 02/11/21 0408  AST _0 63*  ALT 38 36 35 36 75*  ALKPHOS 48 50 59 68 71  BILITOT 2.6* 2.4* 2.7* 2.1* 1.5*  PROT 6.2* 5.8* 5.8* 5.9* 5.7*  ALBUMIN 2.8* 2.7* 2.6* 2.5* 2.4*   No results for input(s): LIPASE, AMYLASE in the last 168 hours. No results for input(s): AMMONIA in the last 168 hours. CBC: Recent Labs  Lab 02/08/21 1723 02/08/21 2040 02/09/21 0536 02/10/21 0343 02/11/21 0408  WBC 9.6 6.6 7.0 8.6 8.8  NEUTROABS 7.7 5.9 6.2 5.3 3.6  HGB 13.1 12.9 12.5 11.5* 11.3*  HCT 39.4 37.6 37.1 34.8* 34.7*  MCV 89.5 87.4 88.3 89.9 90.8  PLT 184 174 153 148* 155   Cardiac Enzymes: Recent Labs  Lab 02/08/21 2040  CKTOTAL 190   BNP: BNP (last 3 results) Recent Labs    02/10/21 0343  BNP 178.9*    ProBNP (last 3 results) No results for input(s): PROBNP in the last 8760 hours.  CBG: No results for input(s): GLUCAP in the last 168  hours.     Signed:  Fayrene Helper MD.  [facility] 02/11/2021, 5:17 PM

## 2021-02-11 NOTE — Progress Notes (Signed)
SATURATION QUALIFICATIONS: (This note is used to comply with regulatory documentation for home oxygen)  Patient Saturations on Room Air at Rest = 97%  Patient Saturations on Room Air while Ambulating = 93%  Patient Saturations on  Liters of oxygen while Ambulating = not applicable  Please briefly explain why patient needs home oxygen:

## 2021-02-11 NOTE — Plan of Care (Signed)
Plan of care reviewed with patient and mother. Noted mother had a few questions regarding IV  anti-biotics and the conversion to PO. Mother made aware that the transition was simple and current meds were not costly but depends on your pharmacy. Pt inquired about cardiomyopathy and I shared with the patient that her ECHO did not reveal that she had Cardiomyopathy. However there was some question regarding her chest x-ray. Film revealed some abnormalities such as PNE verses Pulmonary edema. I shared with mother that either way the current medicine regime is addressing the ? Infection and a dose of lasix has been ordered to remove fluid. Mother questioned the rate of the heart. Shared with mother that the bodies natural response to healing itself often creates an internal reaction like increasing the HR. However noting patients BMI that some level of deconditioning may have taken place. This would prompt normal activity( getting up, walking) to present as taxing to the body. The cure for that is increasing the frequency of activity to a tolerant level. Body reads this level as normal and it begins to balance out. Noted a poor appetite would not produce the fuel required to battle infection process and her attempt to normalize activity. Noted high glucose food items would result in peaking and bottoming out or feeling as if she has no energy. It also slows the healing process. PLAN: GET UP FREQUENTLY AND NO MEALS WHILE IN THE BED. SEVERAL LAPSE AROUND THE UNIT(3LAPSE Q3HRS) FLUTTER VALVE AND IS WHILE AWAKE. INCREASE PO INTAKE MAY REQUEST SUPPLEMENTAL SMOOTHIES . HAND HYGIENE. PT/FAMILY EXPRESSED AN UNDERSTANDING Problem: Education: Goal: Knowledge of General Education information will improve Description: Including pain rating scale, medication(s)/side effects and non-pharmacologic comfort measures Outcome: Progressing   Problem: Clinical Measurements: Goal: Ability to maintain clinical measurements within normal  limits will improve Outcome: Progressing Goal: Will remain free from infection Outcome: Progressing Goal: Diagnostic test results will improve Outcome: Progressing Goal: Respiratory complications will improve Outcome: Progressing Goal: Cardiovascular complication will be avoided Outcome: Progressing   Problem: Activity: Goal: Risk for activity intolerance will decrease Outcome: Progressing   Problem: Coping: Goal: Level of anxiety will decrease Outcome: Progressing

## 2021-02-11 NOTE — Plan of Care (Signed)
°  Problem: Clinical Measurements: Goal: Respiratory complications will improve Outcome: Adequate for Discharge   Problem: Clinical Measurements: Goal: Cardiovascular complication will be avoided Outcome: Adequate for Discharge   Problem: Activity: Goal: Risk for activity intolerance will decrease Outcome: Adequate for Discharge   Problem: Nutrition: Goal: Adequate nutrition will be maintained Outcome: Adequate for Discharge   Problem: Elimination: Goal: Will not experience complications related to bowel motility Outcome: Adequate for Discharge

## 2021-02-12 LAB — LEGIONELLA PNEUMOPHILA SEROGP 1 UR AG: L. pneumophila Serogp 1 Ur Ag: NEGATIVE

## 2021-02-13 LAB — CULTURE, BLOOD (ROUTINE X 2): Culture: NO GROWTH

## 2021-02-14 LAB — CULTURE, BLOOD (ROUTINE X 2)
Culture: NO GROWTH
Special Requests: ADEQUATE

## 2021-02-22 ENCOUNTER — Other Ambulatory Visit: Payer: Self-pay | Admitting: Family Medicine

## 2021-02-22 ENCOUNTER — Telehealth: Payer: Self-pay | Admitting: Physician Assistant

## 2021-02-22 NOTE — Telephone Encounter (Signed)
Scheduled appt per 2/7 referral. Spoke to pt who is aware of appt date and time. Pt is aware to arrive 15 mins prior to appt time.  °

## 2021-02-25 ENCOUNTER — Other Ambulatory Visit: Payer: Self-pay | Admitting: Family Medicine

## 2021-02-25 DIAGNOSIS — R0602 Shortness of breath: Secondary | ICD-10-CM

## 2021-02-25 DIAGNOSIS — R590 Localized enlarged lymph nodes: Secondary | ICD-10-CM

## 2021-03-08 ENCOUNTER — Ambulatory Visit
Admission: RE | Admit: 2021-03-08 | Discharge: 2021-03-08 | Disposition: A | Discharge status: Home / Self Care | Payer: Medicaid Other | Source: Ambulatory Visit | Attending: Family Medicine | Admitting: Family Medicine

## 2021-03-08 DIAGNOSIS — R590 Localized enlarged lymph nodes: Secondary | ICD-10-CM

## 2021-03-08 DIAGNOSIS — R0602 Shortness of breath: Secondary | ICD-10-CM

## 2021-03-09 ENCOUNTER — Other Ambulatory Visit: Payer: Medicaid Other

## 2021-03-16 ENCOUNTER — Inpatient Hospital Stay: Payer: BC Managed Care – PPO

## 2021-03-16 ENCOUNTER — Inpatient Hospital Stay: Payer: BC Managed Care – PPO | Attending: Physician Assistant | Admitting: Physician Assistant

## 2021-03-16 ENCOUNTER — Encounter: Payer: Self-pay | Admitting: Physician Assistant

## 2021-03-16 ENCOUNTER — Other Ambulatory Visit: Payer: Self-pay

## 2021-03-16 VITALS — BP 138/87 | HR 84 | Temp 97.7°F | Resp 20 | Wt 252.5 lb

## 2021-03-16 DIAGNOSIS — D72828 Other elevated white blood cell count: Secondary | ICD-10-CM | POA: Insufficient documentation

## 2021-03-16 DIAGNOSIS — J189 Pneumonia, unspecified organism: Secondary | ICD-10-CM | POA: Diagnosis not present

## 2021-03-16 DIAGNOSIS — D729 Disorder of white blood cells, unspecified: Secondary | ICD-10-CM

## 2021-03-16 LAB — CBC WITH DIFFERENTIAL (CANCER CENTER ONLY)
Abs Immature Granulocytes: 0.01 10*3/uL (ref 0.00–0.07)
Basophils Absolute: 0 10*3/uL (ref 0.0–0.1)
Basophils Relative: 1 %
Eosinophils Absolute: 0.1 10*3/uL (ref 0.0–0.5)
Eosinophils Relative: 3 %
HCT: 40.4 % (ref 36.0–46.0)
Hemoglobin: 13.2 g/dL (ref 12.0–15.0)
Immature Granulocytes: 0 %
Lymphocytes Relative: 51 %
Lymphs Abs: 3 10*3/uL (ref 0.7–4.0)
MCH: 29.3 pg (ref 26.0–34.0)
MCHC: 32.7 g/dL (ref 30.0–36.0)
MCV: 89.8 fL (ref 80.0–100.0)
Monocytes Absolute: 0.3 10*3/uL (ref 0.1–1.0)
Monocytes Relative: 5 %
Neutro Abs: 2.3 10*3/uL (ref 1.7–7.7)
Neutrophils Relative %: 40 %
Platelet Count: 370 10*3/uL (ref 150–400)
RBC: 4.5 MIL/uL (ref 3.87–5.11)
RDW: 12.5 % (ref 11.5–15.5)
WBC Count: 5.7 10*3/uL (ref 4.0–10.5)
nRBC: 0 % (ref 0.0–0.2)

## 2021-03-16 LAB — SAVE SMEAR(SSMR), FOR PROVIDER SLIDE REVIEW

## 2021-03-16 LAB — SEDIMENTATION RATE: Sed Rate: 12 mm/hr (ref 0–22)

## 2021-03-16 LAB — CMP (CANCER CENTER ONLY)
ALT: 26 U/L (ref 0–44)
AST: 15 U/L (ref 15–41)
Albumin: 4.4 g/dL (ref 3.5–5.0)
Alkaline Phosphatase: 77 U/L (ref 38–126)
Anion gap: 7 (ref 5–15)
BUN: 6 mg/dL (ref 6–20)
CO2: 26 mmol/L (ref 22–32)
Calcium: 9.8 mg/dL (ref 8.9–10.3)
Chloride: 105 mmol/L (ref 98–111)
Creatinine: 0.59 mg/dL (ref 0.44–1.00)
GFR, Estimated: >60 mL/min (ref >60)
Glucose, Bld: 75 mg/dL (ref 70–99)
Potassium: 3.9 mmol/L (ref 3.5–5.1)
Sodium: 138 mmol/L (ref 135–145)
Total Bilirubin: 0.4 mg/dL (ref 0.3–1.2)
Total Protein: 8.2 g/dL — ABNORMAL HIGH (ref 6.5–8.1)

## 2021-03-16 LAB — C-REACTIVE PROTEIN: CRP: 0.8 mg/dL (ref <1.0)

## 2021-03-16 NOTE — Progress Notes (Signed)
[facility] [phone]   [phone]  INITIAL CONSULT NOTE  Patient Care Team: Pcp, No as PCP - General  CHIEF COMPLAINTS/PURPOSE OF CONSULTATION:  Leukocytosis  HISTORY OF PRESENTING ILLNESS:  Beverly Clark 21 y.o. 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  Patient is unaccompanied for this visit.  On review of the previous records, patient was admitted from February 08, 2021 to February 11, 2021 for sepsis due to pneumonia and acute kidney injury.  CT chest on 02/09/2021 showed streaky bandlike opacities in both lower lobes that was concerning for pneumonia along with mildly prominent axillary/mediastinal lymph nodes bilaterally.  Was treated with antibiotics and IV fluids. She was discharged 2-day course of azithromycin 500 mg p.o. daily and 4-day course of cefpodoxime 200 mg p.o. twice daily.  Repeat CT chest from 03/08/2021 showed resolution of pulmonary opacities that were previously seen along with lymphadenopathy.  On exam today, Beverly Clark reports that her energy levels have improved since hospital discharge.  She is back to her baseline activity level.  She has a good appetite and denies any recent weight changes.  She denies any nausea, vomiting or abdominal pain.  Her bowel habits are regular without any diarrhea or constipation.  Patient has any fevers, chills, night sweats, shortness of breath, chest pain, cough or urinary symptoms.  She has no other complaints.  Rest of the 10 point ROS is below.  MEDICAL HISTORY:  Past Medical History:  Diagnosis Date   Scarlet fever     SURGICAL HISTORY: History reviewed. No pertinent surgical history.  SOCIAL HISTORY: Social History   Socioeconomic History   Marital status: Single    Spouse name: Not on file   Number of children: Not on file   Years of education: Not on file   Highest education level: Not on file  Occupational History   Not on file  Tobacco Use   Smoking  status: Never   Smokeless tobacco: Never  Vaping Use   Vaping Use: Never used  Substance and Sexual Activity   Alcohol use: Never   Drug use: Never   Sexual activity: Not Currently  Other Topics Concern   Not on file  Social History Narrative   Not on file   Social Determinants of Health   Financial Resource Strain: Not on file  Food Insecurity: Not on file  Transportation Needs: Not on file  Physical Activity: Not on file  Stress: Not on file  Social Connections: Not on file  Intimate Partner Violence: Not on file    FAMILY HISTORY: Family History  Problem Relation Age of Onset   Diabetes Maternal Grandmother    Cancer - Lung Maternal Grandmother    Prostate cancer Paternal Grandfather    Cancer Other     ALLERGIES:  is allergic to penicillins.  MEDICATIONS:  Current Outpatient Medications  Medication Sig Dispense Refill   Cholecalciferol (VITAMIN D3 PO) Take by mouth once a week.     albuterol (VENTOLIN HFA) 108 (90 Base) MCG/ACT inhaler Inhale 2 puffs into the lungs every 6 (six) hours as needed for wheezing or shortness of breath. (Patient not taking: Reported on 03/16/2021) 8 g 0   ondansetron (ZOFRAN-ODT) 4 MG disintegrating tablet Take 1 tablet (4 mg total) by mouth every 8 (eight) hours as needed for nausea or vomiting. (Patient not taking: Reported on 02/08/2021) 10 tablet 0   No current facility-administered medications for this visit.    REVIEW OF SYSTEMS:  Constitutional: ( - ) fevers, ( - )  chills , ( - ) night sweats Eyes: ( - ) blurriness of vision, ( - ) double vision, ( - ) watery eyes Ears, nose, mouth, throat, and face: ( - ) mucositis, ( - ) sore throat Respiratory: ( - ) cough, ( - ) dyspnea, ( - ) wheezes Cardiovascular: ( - ) palpitation, ( - ) chest discomfort, ( - ) lower extremity swelling Gastrointestinal:  ( - ) nausea, ( - ) heartburn, ( - ) change in bowel habits Skin: ( - ) abnormal skin rashes Lymphatics: ( - ) new lymphadenopathy, (  - ) easy bruising Neurological: ( - ) numbness, ( - ) tingling, ( - ) new weaknesses Behavioral/Psych: ( - ) mood change, ( - ) new changes  All other systems were reviewed with the patient and are negative.  PHYSICAL EXAMINATION: ECOG PERFORMANCE STATUS: 0 - Asymptomatic  Vitals:   03/16/21 1120  BP: 138/87  Pulse: 84  Resp: 20  Temp: 97.7 F (36.5 C)  SpO2: 100%   Filed Weights   03/16/21 1120  Weight: 252 lb 8 oz (114.5 kg)    GENERAL: well appearing female in NAD  SKIN: skin color, texture, turgor are normal, no rashes or significant lesions EYES: conjunctiva are pink and non-injected, sclera clear OROPHARYNX: no exudate, no erythema; lips, buccal mucosa, and tongue normal  NECK: supple, non-tender LYMPH:  no palpable lymphadenopathy in the cervical or supraclavicular lymph nodes.  LUNGS: clear to auscultation and percussion with normal breathing effort HEART: regular rate & rhythm and no murmurs and no lower extremity edema ABDOMEN: soft, non-tender, non-distended, normal bowel sounds Musculoskeletal: no cyanosis of digits and no clubbing  PSYCH: alert & oriented x 3, fluent speech NEURO: no focal motor/sensory deficits  LABORATORY DATA:  I have reviewed the data as listed CBC Latest Ref Rng & Units 03/16/2021 02/11/2021 02/10/2021  WBC 4.0 - 10.5 K/uL 5.7 8.8 8.6  Hemoglobin 12.0 - 15.0 g/dL 40.9 11.3(L) 11.5(L)  Hematocrit 36.0 - 46.0 % 40.4 34.7(L) 34.8(L)  Platelets 150 - 400 K/uL 370 155 148(L)    CMP Latest Ref Rng & Units 03/16/2021 02/11/2021 02/10/2021  Glucose 70 - 99 mg/dL 75 86 87  BUN 6 - 20 mg/dL 6 7 12   Creatinine 0.44 - 1.00 mg/dL 8.11 9.14 7.82  Sodium 135 - 145 mmol/L 138 133(L) 137  Potassium 3.5 - 5.1 mmol/L 3.9 2.9(L) 3.5  Chloride 98 - 111 mmol/L 105 101 107  CO2 22 - 32 mmol/L 26 24 23   Calcium 8.9 - 10.3 mg/dL 9.8 8.1(L) 8.3(L)  Total Protein 6.5 - 8.1 g/dL 8.2(H) 5.7(L) 5.9(L)  Total Bilirubin 0.3 - 1.2 mg/dL 0.4 9.5(A) 2.1(H)  Alkaline  Phos 38 - 126 U/L 77 71 68  AST 15 - 41 U/L 15 63(H) 31  ALT 0 - 44 U/L 26 75(H) 36    RADIOGRAPHIC STUDIES: I have personally reviewed the radiological images as listed and agreed with the findings in the report. CT CHEST WO CONTRAST  Result Date: 03/09/2021 CLINICAL DATA:  Follow-up pneumonia. EXAM: CT CHEST WITHOUT CONTRAST TECHNIQUE: Multidetector CT imaging of the chest was performed following the standard protocol without IV contrast. RADIATION DOSE REDUCTION: This exam was performed according to the departmental dose-optimization program which includes automated exposure control, adjustment of the mA and/or kV according to patient size and/or use of iterative reconstruction technique. COMPARISON:  Chest radiograph dated 02/11/2021 and CT dated 02/09/2021 FINDINGS: Evaluation  of this exam is limited in the absence of intravenous contrast. Cardiovascular: There is no cardiomegaly or pericardial effusion. The thoracic aorta and central pulmonary arteries are grossly unremarkable. Mediastinum/Nodes: No hilar or mediastinal adenopathy. The esophagus and the thyroid gland are grossly unremarkable. No mediastinal fluid collection. Residual thymic tissue noted in the anterior mediastinum. Lungs/Pleura: The lungs are clear. There is no pleural effusion pneumothorax. The central airways are patent. Upper Abdomen: No acute abnormality. Musculoskeletal: No chest wall mass or suspicious bone lesions identified. IMPRESSION: No acute intrathoracic pathology. Interval resolution of the previously seen pulmonary opacities. Electronically Signed   By: Elgie Collard M.D.   On: 03/09/2021 13:35    ASSESSMENT & PLAN Beverly Clark is a 21 y.o. female who presents to the clinic for evaluation for leukocytosis.  I reviewed the prior labs from her recent hospitalization that shows no evidence of leukocytosis but WBC morphology showed mild left shift with metamyelocytes.  I reviewed causes for leukocytosis including  infectious process, inflammatory process and bone marrow disorders.  Likely cause of patient's laboratory findings is from recent sepsis secondary to pneumonia. Patient will proceed with serologic work-up today to check CBC with differential, CMP, inflammatory markers and a save smear.We will follow-up by phone to review the results and discuss if additional work-up needs to be done.  Orders Placed This Encounter  Procedures   CBC with Differential (Cancer Center Only)    Standing Status:   Future    Number of Occurrences:   1    Standing Expiration Date:   09/16/2021   CMP (Cancer Center only)    Standing Status:   Future    Number of Occurrences:   1    Standing Expiration Date:   09/16/2021   Sedimentation rate    Standing Status:   Future    Number of Occurrences:   1    Standing Expiration Date:   09/16/2021   Save Smear (SSMR)    Standing Status:   Future    Number of Occurrences:   1    Standing Expiration Date:   09/16/2021   C-reactive protein    Standing Status:   Future    Number of Occurrences:   1    Standing Expiration Date:   09/16/2021    All questions were answered. The patient knows to call the clinic with any problems, questions or concerns.  I have spent a total of 60 minutes minutes of face-to-face and non-face-to-face time, preparing to see the patient, performing a medically appropriate examination, counseling and educating the patient, ordering tests/procedures,  documenting clinical information in the electronic health record, and care coordination.   Georga Kaufmann, PA-C Department of Hematology/Oncology [facility] at [facility] Phone: [phone]  Patient was seen with Dr. Leonides Schanz  I have read the above note and personally examined the patient. I agree with the assessment and plan as noted above.  Briefly Beverly Clark is a 21 year old female who presents for evaluation of leukocytosis.  The patient had leukocytosis with left shift  noted during a pneumonia infection.  Due to the presence of myelocytes and metamyelocytes there was concern that further hematological evaluation was required.  At this time the findings are most consistent with an appropriate left shift in the setting of acute infection.  Suspect that with adequate treatment of pneumonia her white blood cell count has normalized.  We will recheck the white blood cell count today.  In the event that  there is persistent immature forms within the blood we could consider further evaluation.  The patient voiced understanding of this plan moving forward.   Ulysees Barns, MD Department of Hematology/Oncology [facility] at [facility] Phone: [phone] Pager: [phone] Email: [email or web address]

## 2021-03-17 DIAGNOSIS — D729 Disorder of white blood cells, unspecified: Secondary | ICD-10-CM | POA: Insufficient documentation

## 2021-03-18 ENCOUNTER — Telehealth: Payer: Self-pay

## 2021-03-18 NOTE — Telephone Encounter (Signed)
-----   Message from Briant Cedar, PA-C sent at 03/17/2021  3:29 PM EST ----- ?Please notify patient and labs show no evidence of abnormal WBC. Inflammatory markers have resolved back to normal. No further workup is required at this time. She can return to our clinic as needed.  ?

## 2021-03-18 NOTE — Telephone Encounter (Signed)
Pt advised with VU 

## 2021-03-25 ENCOUNTER — Other Ambulatory Visit: Payer: Medicaid Other

## 2023-03-14 ENCOUNTER — Other Ambulatory Visit: Payer: Self-pay

## 2023-03-14 ENCOUNTER — Ambulatory Visit
Admission: RE | Admit: 2023-03-14 | Discharge: 2023-03-14 | Disposition: A | Discharge status: Home / Self Care | Payer: Self-pay | Source: Ambulatory Visit | Attending: Family Medicine | Admitting: Family Medicine

## 2023-03-14 VITALS — BP 139/83 | HR 85 | Temp 97.8°F | Resp 18 | Ht 66.0 in | Wt 262.0 lb

## 2023-03-14 DIAGNOSIS — H109 Unspecified conjunctivitis: Secondary | ICD-10-CM | POA: Diagnosis not present

## 2023-03-14 MED ORDER — MOXIFLOXACIN HCL 0.5 % OP SOLN: 1.0000 [drp] | Freq: Three times a day (TID) | OPHTHALMIC | 0 refills | Status: AC

## 2023-03-14 NOTE — ED Provider Notes (Signed)
 [facility]    CSN: [id number] Arrival date & time: 03/14/23  1032      History   Chief Complaint Chief Complaint  Patient presents with   Eye Problem    Eye is pink, watering and crusting overnight - Entered by patient    HPI Jaydalee Bardwell Jagger is a 23 y.o. female.   The history is provided by the patient.  Eye Problem Right eye irritation for 10 days, admits redness, tearing, matting, crusting.  Denies injury, foreign body sensation, recent illness.  Wears glasses but not contacts.  Denies rhinorrhea, nasal congestion, sore throat, cough, known contacts with pinkeye.  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  Denies history of prior eye issues  Past Medical History:  Diagnosis Date   Scarlet fever     Patient Active Problem List   Diagnosis Date Noted   Abnormal WBC count 03/17/2021   Sinus tachycardia 02/10/2021   Lactic acidosis 02/09/2021   Shortness of breath 02/09/2021   Lymphadenopathy 02/09/2021   Elevated liver enzymes 02/09/2021   Tattoo of skin 02/09/2021   Foley catheter in place 02/09/2021   Sepsis due to pneumonia [facility]) 02/08/2021   AKI (acute kidney injury) (HCC) 02/08/2021   Hyponatremia 02/08/2021   Hypokalemia 02/08/2021   Dehydration 02/08/2021   Hypomagnesemia 02/08/2021   Diarrhea 02/08/2021    History reviewed. No pertinent surgical history.  OB History   No obstetric history on file.      Home Medications    Prior to Admission medications   Medication Sig Start Date End Date Taking? Authorizing Provider  albuterol (VENTOLIN HFA) 108 (90 Base) MCG/ACT inhaler Inhale 2 puffs into the lungs every 6 (six) hours as needed for wheezing or shortness of breath. Patient not taking: Reported on 03/16/2021 02/11/21   Zigmund Daniel., MD  Cholecalciferol (VITAMIN D3 PO) Take by mouth once a week.    [provider]  ondansetron (ZOFRAN-ODT) 4 MG disintegrating tablet Take 1 tablet (4 mg total) by mouth every 8 (eight)  hours as needed for nausea or vomiting. Patient not taking: Reported on 02/08/2021 02/07/21   Zenia Resides, MD    Family History Family History  Problem Relation Age of Onset   Diabetes Maternal Grandmother    Cancer - Lung Maternal Grandmother    Prostate cancer Paternal Grandfather    Cancer Other     Social History Social History   Tobacco Use   Smoking status: Never   Smokeless tobacco: Never  Vaping Use   Vaping status: Never Used  Substance Use Topics   Alcohol use: Never   Drug use: Never     Allergies   Penicillins   Review of Systems Review of Systems   Physical Exam Triage Vital Signs ED Triage Vitals  Encounter Vitals Group     BP 03/14/23 1046 139/83     Systolic BP Percentile --      Diastolic BP Percentile --      Pulse Rate 03/14/23 1046 85     Resp 03/14/23 1046 18     Temp 03/14/23 1046 97.8 F (36.6 C)     Temp Source 03/14/23 1046 Oral     SpO2 03/14/23 1046 98 %     Weight 03/14/23 1045 262 lb (118.8 kg)     Height 03/14/23 1045 5\' 6"  (1.676 m)     Head Circumference --      Peak Flow --      Pain  Score 03/14/23 1045 1     Pain Loc --      Pain Education --      Exclude from Growth Chart --    No data found.  Updated Vital Signs BP 139/83 (BP Location: Right Arm)   Pulse 85   Temp 97.8 F (36.6 C) (Oral)   Resp 18   Ht 5\' 6"  (1.676 m)   Wt 262 lb (118.8 kg)   LMP 02/25/2023 (Exact Date)   SpO2 98%   BMI 42.29 kg/m   Visual Acuity Right Eye Distance:   Left Eye Distance:   Bilateral Distance:    Right Eye Near:   Left Eye Near:    Bilateral Near:     Physical Exam Vitals and nursing note reviewed.  Constitutional:      Appearance: She is not ill-appearing.  HENT:     Head: Normocephalic and atraumatic.     Right Ear: Tympanic membrane and ear canal normal.     Left Ear: Tympanic membrane and ear canal normal.  Eyes:     General: Lids are normal.        Right eye: Discharge present.     Extraocular  Movements:     Right eye: Normal extraocular motion.     Conjunctiva/sclera:     Right eye: Right conjunctiva is injected. No chemosis, exudate or hemorrhage.    Pupils: Pupils are equal, round, and reactive to light.     Right eye: No fluorescein uptake.  Neurological:     Mental Status: She is alert.      UC Treatments / Results  Labs (all labs ordered are listed, but only abnormal results are displayed) Labs Reviewed - No data to display  EKG   Radiology No results found.  Procedures Procedures (including critical care time)  Medications Ordered in UC Medications - No data to display  Initial Impression / Assessment and Plan / UC Course  I have reviewed the triage vital signs and the nursing notes.  Pertinent labs & imaging results that were available during my care of the patient were reviewed by me and considered in my medical decision making (see chart for details).     23 year old female with right eye redness drainage matting and crusting for 10 days, significant injection, drainage and crusting.  No fluorescein dye uptake, will start on antibiotics recommend she see ophthalmology as soon as possible  Final diagnoses:  None   Discharge Instructions   None    ED Prescriptions   None    PDMP not reviewed this encounter.   Meliton Rattan, [place] 03/14/23 1104

## 2023-03-14 NOTE — ED Triage Notes (Addendum)
 Pt presents with complaints of right eye drainage and redness x 10 days. Pt currently rates her pain a 1/10. OTC saline eye drops used in right eye with temporary relief. Pt states her right eye symptoms are becoming worse. Denies blurred vision.

## 2023-05-10 IMAGING — CT CT CHEST W/O CM
2 of 5 series · 15 of 36 positions shown, 18 images · non-contrast
Comparison: Chest radiograph dated 02/11/2021 and CT dated
02/09/2021

CLINICAL DATA: Follow-up pneumonia.



[Series 4: chest 2.00 br40 s3 · coronal · 0.63mm/px · 3 of 155 slices shown]
[im 31/155  lung]
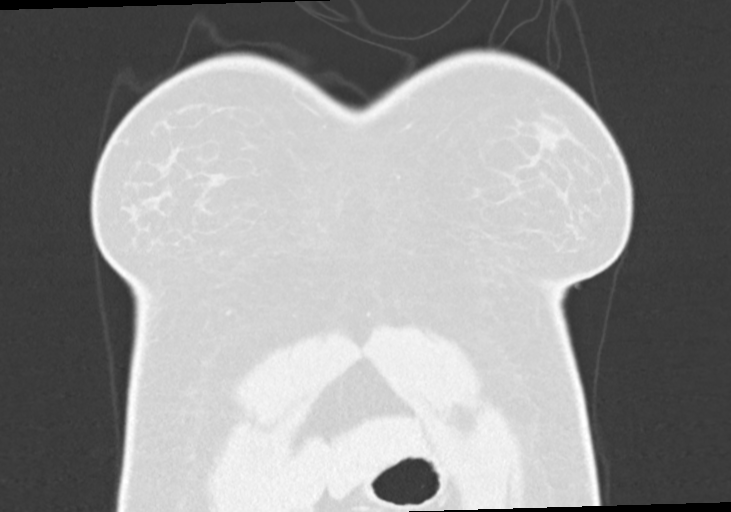
[im 62/155  lung]
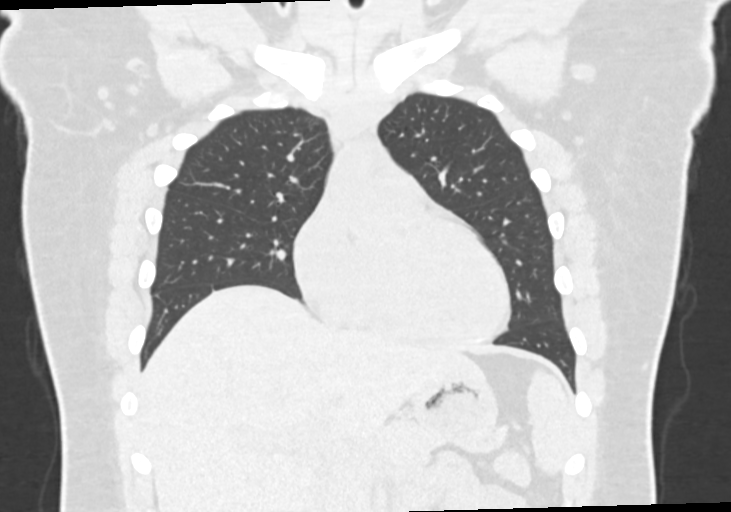
[im 93/155  lung]
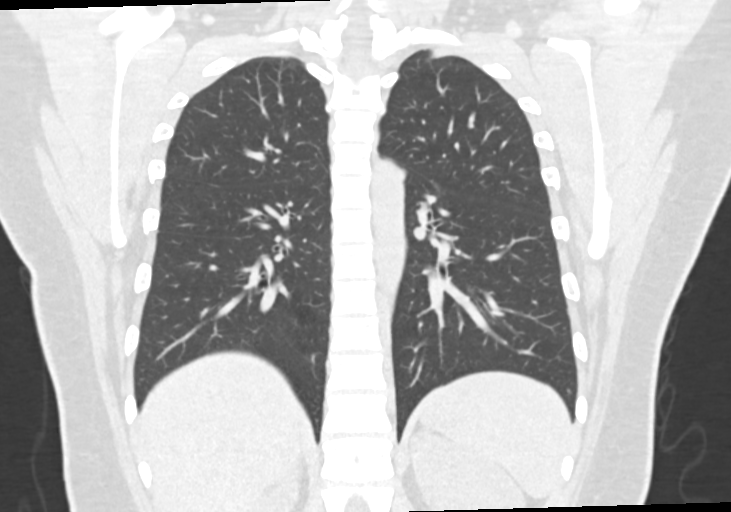

[Series 10: chest 1.00 br40 s3 super d · axial · 0.83mm/px · z∈[+1601,+1881]mm · 12 of 403 slices shown, 15 images]
[im 27/403  mediastinal]
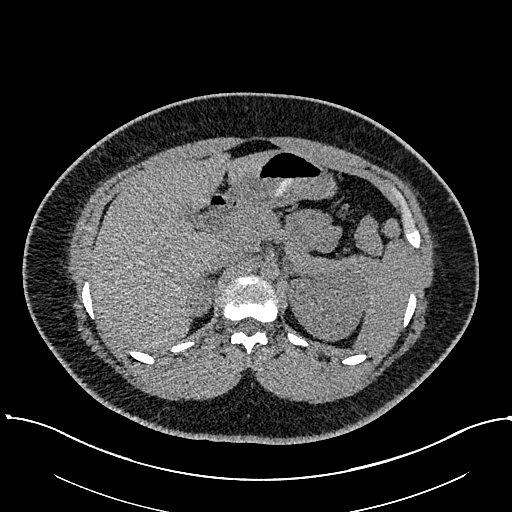
[im 27/403  lung]
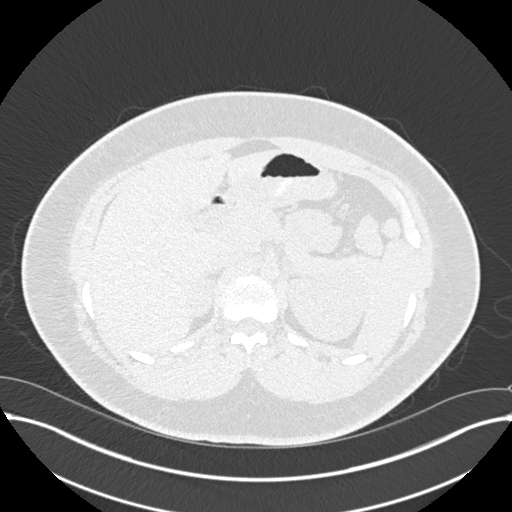
[im 54/403  lung]
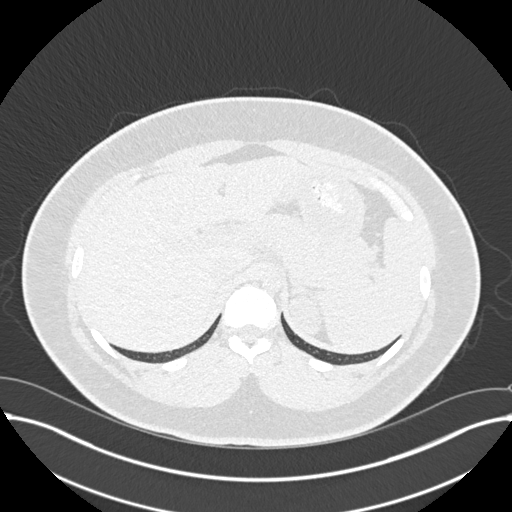
[im 81/403  lung]
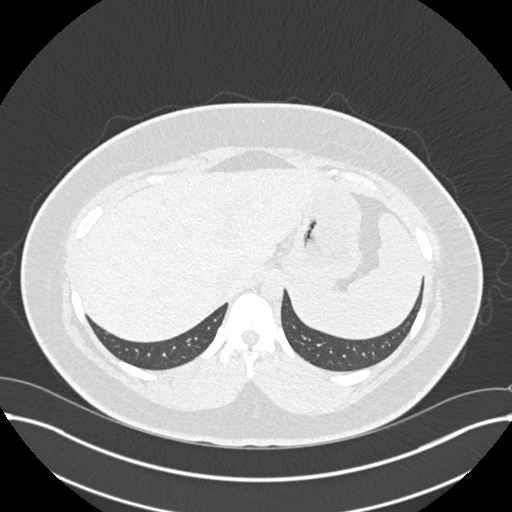
[im 135/403  lung]
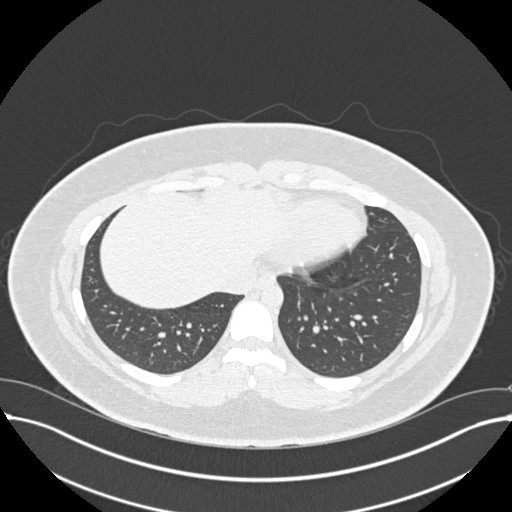
[im 161/403  mediastinal]
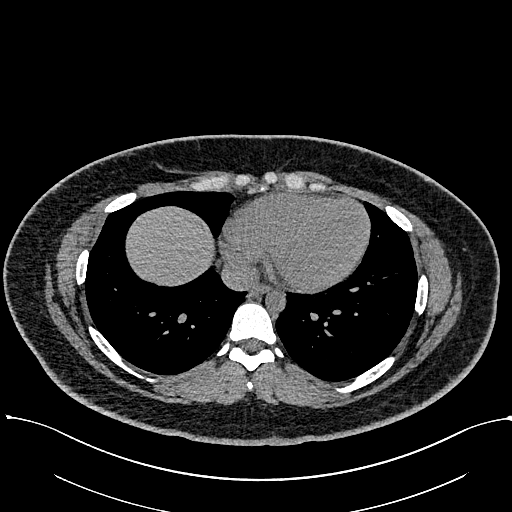
[im 161/403  lung]
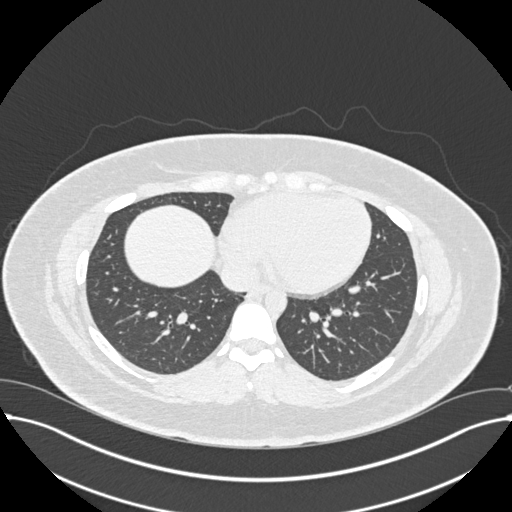
[im 188/403  lung]
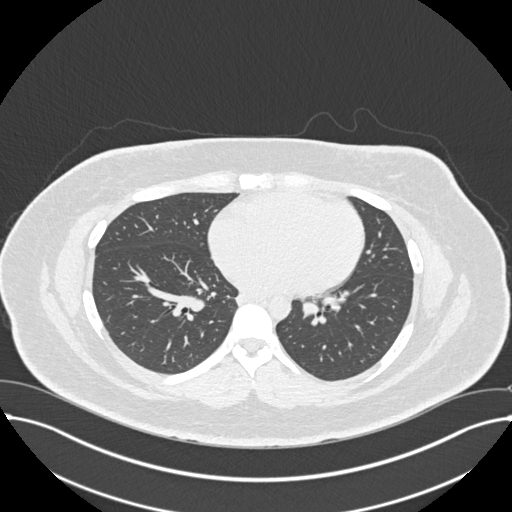
[im 215/403  lung]
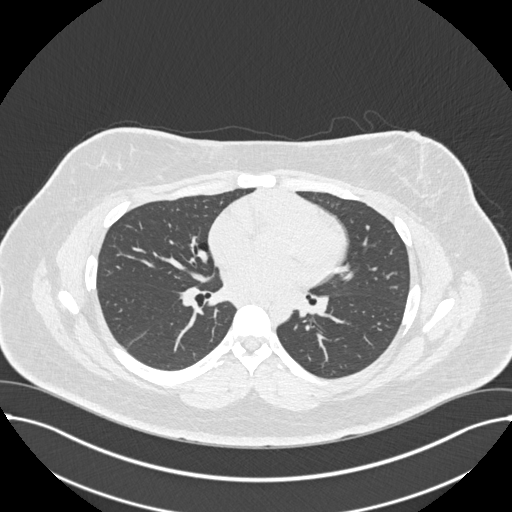
[im 242/403  lung]
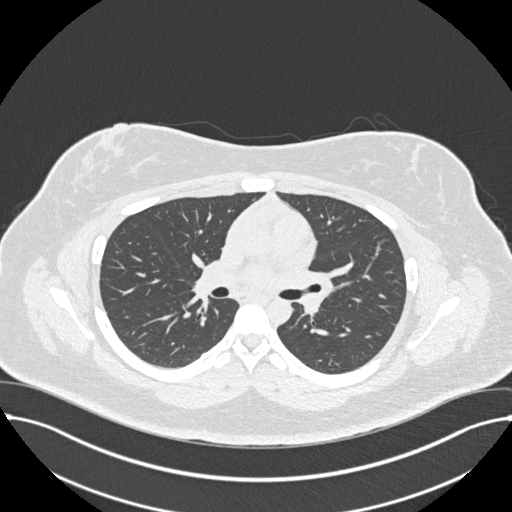
[im 269/403  mediastinal]
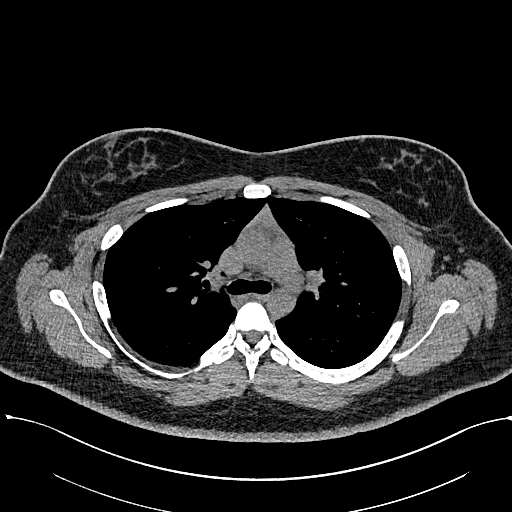
[im 269/403  lung]
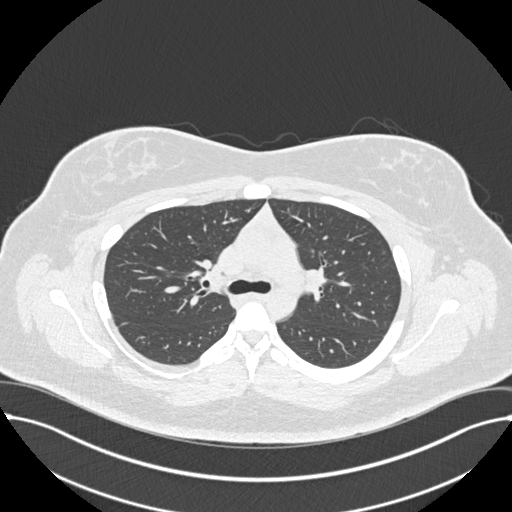
[im 322/403  lung]
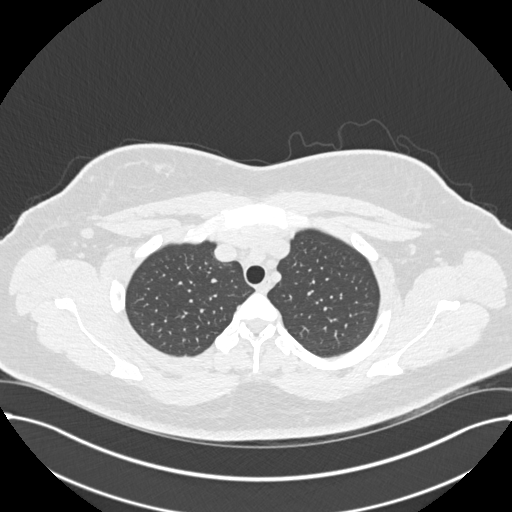
[im 349/403  lung]
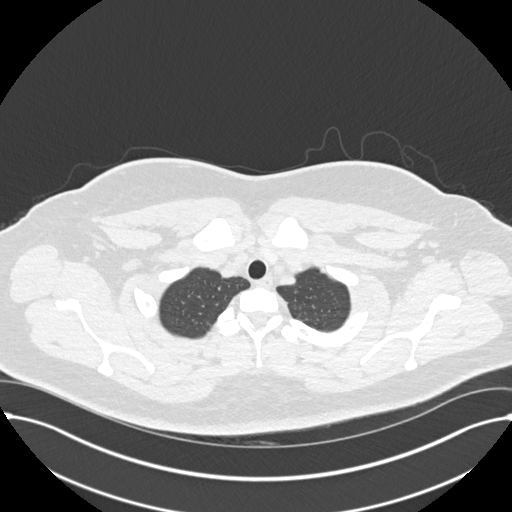
[im 376/403  lung]
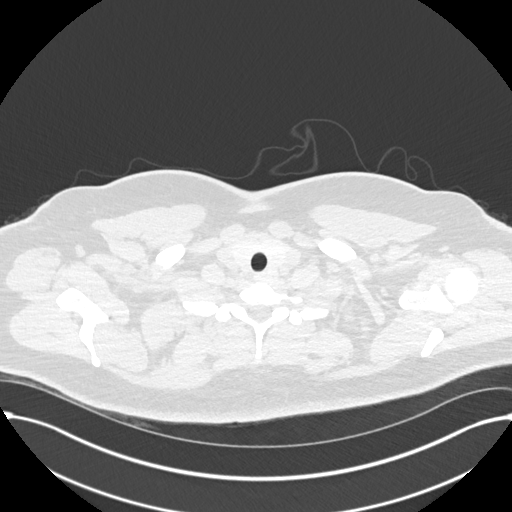

[15 of 36 positions shown; findings below may reference images not displayed]

FINDINGS: Evaluation of this exam is limited in the absence of intravenous
contrast.

Cardiovascular: There is no cardiomegaly or pericardial effusion.
The thoracic aorta and central pulmonary arteries are grossly
unremarkable.

Mediastinum/Nodes: No hilar or mediastinal adenopathy. The esophagus
and the thyroid gland are grossly unremarkable. No mediastinal fluid
collection. Residual thymic tissue noted in the anterior
mediastinum.

Lungs/Pleura: The lungs are clear. There is no pleural effusion
pneumothorax. The central airways are patent.

Upper Abdomen: No acute abnormality.

Musculoskeletal: No chest wall mass or suspicious bone lesions
identified.
IMPRESSION: No acute intrathoracic pathology. Interval resolution of the
previously seen pulmonary opacities.

## 2023-12-10 ENCOUNTER — Other Ambulatory Visit (HOSPITAL_COMMUNITY)
Admission: RE | Admit: 2023-12-10 | Discharge: 2023-12-10 | Disposition: A | Discharge status: Home / Self Care | Source: Ambulatory Visit | Attending: Adult Health | Admitting: Adult Health

## 2023-12-10 ENCOUNTER — Encounter: Payer: Self-pay | Admitting: Obstetrics & Gynecology

## 2023-12-10 ENCOUNTER — Ambulatory Visit (INDEPENDENT_AMBULATORY_CARE_PROVIDER_SITE_OTHER): Admitting: Obstetrics & Gynecology

## 2023-12-10 VITALS — BP 124/86 | HR 89 | Ht 67.0 in | Wt 262.0 lb

## 2023-12-10 DIAGNOSIS — Z1151 Encounter for screening for human papillomavirus (HPV): Secondary | ICD-10-CM | POA: Diagnosis not present

## 2023-12-10 DIAGNOSIS — Z1331 Encounter for screening for depression: Secondary | ICD-10-CM

## 2023-12-10 DIAGNOSIS — Z01419 Encounter for gynecological examination (general) (routine) without abnormal findings: Secondary | ICD-10-CM

## 2023-12-10 DIAGNOSIS — Z124 Encounter for screening for malignant neoplasm of cervix: Secondary | ICD-10-CM | POA: Diagnosis present

## 2023-12-10 NOTE — Progress Notes (Signed)
 WELL-WOMAN EXAMINATION Patient name: Beverly Clark MRN [id number]  Date of birth: [birth year] Chief Complaint:   Annual Exam  History of Present Illness:   Beverly Clark is a 23 y.o. G0P0000  female being seen today for a routine well-woman exam.   Menses are every month- typically last for 4-5 days.  First 2 days heavy then lighten up.  No intermenstrual bleeding.    Not sexually active.  Declined contraception at this time.  Previously had Nexplanon  and was unhappy with irregular spotting.  Denies vaginal discharge, itching or irritation.  Denies pelvic or abdominal pain   Patient's last menstrual period was 12/10/2023 (approximate).  The current method of family planning is abstinence.    Last pap collected today.  Last mammogram: NA. Last colonoscopy: NA     12/10/2023    9:17 AM 04/22/2018    8:51 AM 07/09/2017    2:03 PM 07/05/2017    3:09 PM  Depression screen PHQ 2/9  Decreased Interest 0 0 0 0  Down, Depressed, Hopeless 0 0 0 0  PHQ - 2 Score 0 0 0 0  Altered sleeping 0     Tired, decreased energy 0     Change in appetite 0     Feeling bad or failure about yourself  0     Trouble concentrating 0     Moving slowly or fidgety/restless 0     Suicidal thoughts 0     PHQ-9 Score 0         Review of Systems:   Pertinent items are noted in HPI Denies any headaches, blurred vision, fatigue, shortness of breath, chest pain, abdominal pain, bowel movements, urination, or intercourse unless otherwise stated above.  Pertinent History Reviewed:  Reviewed past medical,surgical, social and family history.  Reviewed problem list, medications and allergies. Physical Assessment:   Vitals:   12/10/23 0910  BP: 124/86  Pulse: 89  Weight: 262 lb (118.8 kg)  Height: 5' 7 (1.702 m)  Body mass index is 41.04 kg/m.        Physical Examination:   General appearance - well appearing, and in no distress  Mental status - alert, oriented to person, place, and  time  Psych:  She has a normal mood and affect  Skin - warm and dry, normal color, no suspicious lesions noted  Chest - effort normal, all lung fields clear to auscultation bilaterally  Heart - normal rate and regular rhythm  Neck:  midline trachea, no thyromegaly or nodules  Breasts - breasts appear normal, no suspicious masses, no skin or nipple changes or  axillary nodes  Abdomen - soft, nontender, nondistended, no masses or organomegaly  Pelvic - VULVA: normal appearing vulva with no masses, tenderness or lesions  VAGINA: normal appearing vagina with normal color and discharge, no lesions  CERVIX: normal appearing cervix without discharge or lesions, no CMT  Thin prep pap is done with HR HPV cotesting-currently on menses day 1  UTERUS: uterus is felt to be normal size, shape, consistency and nontender   ADNEXA: No adnexal masses or tenderness noted.  Extremities:  No swelling or varicosities noted  Chaperone: Aleck Blase     Assessment & Plan:  1) Well-Woman Exam -Pap collected, reviewed ASCCP guidelines - Reviewed family-planning, contraception and fertility - Declined STI blood work testing  No orders of the defined types were placed in this encounter.   Meds: No orders of the defined types were placed in this encounter.  Follow-up: Return in about 1 year (around 12/09/2024) for Annual.   Angelea Penny, DO Attending Obstetrician & Gynecologist, [facility], [facility]

## 2023-12-12 ENCOUNTER — Ambulatory Visit: Payer: Self-pay | Admitting: Obstetrics & Gynecology

## 2023-12-12 LAB — CYTOLOGY - PAP
Chlamydia: NEGATIVE
Comment: NEGATIVE
Comment: NEGATIVE
Comment: NORMAL
Diagnosis: NEGATIVE
High risk HPV: NEGATIVE
Neisseria Gonorrhea: NEGATIVE

## 2023-12-17 ENCOUNTER — Ambulatory Visit: Admitting: Family Medicine
